# Patient Record
Sex: Female | Born: 1995 | Race: Black or African American | Hispanic: No | Marital: Single | Smoking: Never smoker
Health system: Southern US, Community
[De-identification: ages and names within clinical notes are randomized; demographics above are authoritative.]

## PROBLEM LIST (undated history)

## (undated) ENCOUNTER — Emergency Department (HOSPITAL_COMMUNITY): Admission: EM | Payer: BLUE CROSS/BLUE SHIELD | Source: Home / Self Care

## (undated) DIAGNOSIS — D6859 Other primary thrombophilia: Secondary | ICD-10-CM

## (undated) DIAGNOSIS — J45909 Unspecified asthma, uncomplicated: Secondary | ICD-10-CM

## (undated) HISTORY — PX: WISDOM TOOTH EXTRACTION: SHX21

---

## 2015-09-26 ENCOUNTER — Ambulatory Visit (HOSPITAL_COMMUNITY)
Admission: EM | Admit: 2015-09-26 | Discharge: 2015-09-26 | Disposition: A | Discharge status: Home / Self Care | Payer: BLUE CROSS/BLUE SHIELD | Attending: Emergency Medicine | Admitting: Emergency Medicine

## 2015-09-26 ENCOUNTER — Encounter (HOSPITAL_COMMUNITY): Payer: Self-pay | Admitting: *Deleted

## 2015-09-26 DIAGNOSIS — J45901 Unspecified asthma with (acute) exacerbation: Secondary | ICD-10-CM

## 2015-09-26 HISTORY — DX: Unspecified asthma, uncomplicated: J45.909

## 2015-09-26 MED ORDER — IPRATROPIUM-ALBUTEROL 0.5-2.5 (3) MG/3ML IN SOLN
RESPIRATORY_TRACT | Status: AC
Start: 2015-09-26 — End: 2015-09-26
  Filled 2015-09-26: qty 3

## 2015-09-26 MED ORDER — PREDNISONE 20 MG PO TABS: ORAL_TABLET | ORAL | Status: DC

## 2015-09-26 MED ORDER — IPRATROPIUM-ALBUTEROL 0.5-2.5 (3) MG/3ML IN SOLN
3.0000 mL | Freq: Once | RESPIRATORY_TRACT | Status: AC
  Administered 2015-09-26: 3 mL via RESPIRATORY_TRACT

## 2015-09-26 NOTE — ED Notes (Signed)
pefr  300   Prior  To  treatment

## 2015-09-26 NOTE — ED Provider Notes (Signed)
CSN: [id number]     Arrival date & time 09/26/15  1644 History   First MD Initiated Contact with Patient 09/26/15 1741     Chief Complaint  Patient presents with  . Asthma   (Consider location/radiation/quality/duration/timing/severity/associated sxs/prior Treatment) HPI Patient states that she had used her inhaler 3 times today due to asthma attacks. States that her chest feels tight. States that she has never used steroids for her asthma. Generally uses her rescue inhaler and does not have any other problem.  Past Medical History  Diagnosis Date  . Asthma    No past surgical history on file. No family history on file. Social History  Substance Use Topics  . Smoking status: None  . Smokeless tobacco: None  . Alcohol Use: None   OB History    No data available     Review of Systems Asthma flare  Allergies  Citrus  Home Medications   Prior to Admission medications   Medication Sig Start Date End Date Taking? Authorizing Provider  ALBUTEROL IN Inhale into the lungs.   Yes Historical Provider, MD  Montelukast Sodium (SINGULAIR PO) Take by mouth.   Yes Historical Provider, MD   Meds Ordered and Administered this Visit  Medications - No data to display  BP 124/83 mmHg  Pulse 74  Temp(Src) 98.6 F (37 C) (Oral)  Resp 16  SpO2 100% No data found.   Physical Exam NURSES NOTES AND VITAL SIGNS REVIEWED. CONSTITUTIONAL: Well developed, well nourished, no acute distress HEENT: normocephalic, atraumatic, right and left TM's are normal EYES: Conjunctiva normal NECK:normal ROM, supple, no adenopathy PULMONARY:No respiratory distress, normal effort, Lungs: CTAb/l, no wheezes, or increased work of breathing, speaking in full sentences. No wheezing.  CARDIOVASCULAR: RRR, no murmur ABDOMEN: soft, ND, NT, +'ve BS MUSCULOSKELETAL: Normal ROM of all extremities,  SKIN: warm and dry without rash PSYCHIATRIC: Mood and affect, behavior are normal  ED Course  Procedures  (including critical care time)  Labs Review Labs Reviewed - No data to display  Imaging Review No results found.   Visual Acuity Review  Right Eye Distance:   Left Eye Distance:   Bilateral Distance:    Right Eye Near:   Left Eye Near:    Bilateral Near:     Pretreatment PEFR<350 Post treatement  >400 She states she is feeling better.  Rx for prednisone given as wll a PEFR meter and parameters to return to hospital Pt states that is going home Friday and can follow up with her doctor at that time.     MDM   1. Acute asthma flare, unspecified asthma severity     Patient is reassured that there are no issues that require transfer to higher level of care at this time or additional tests. Patient is advised to continue home symptomatic treatment. Patient is advised that if there are new or worsening symptoms to attend the emergency department, contact primary care provider, or return to [facility]. Instructions of care provided discharged home in stable condition.    THIS NOTE WAS GENERATED USING A VOICE RECOGNITION SOFTWARE PROGRAM. ALL REASONABLE EFFORTS  WERE MADE TO PROOFREAD THIS DOCUMENT FOR ACCURACY.  I have verbally reviewed the discharge instructions with the patient. A printed AVS was given to the patient.  All questions were answered prior to discharge.      Tharon AquasFrank C Patrick, PA 09/26/15 [phone]

## 2015-09-26 NOTE — Discharge Instructions (Signed)
Asthma, Adult Keep an eye on PEAK Flow as this can predict when you need to go to the hospital Asthma is a condition of the lungs in which the airways tighten and narrow. Asthma can make it hard to breathe. Asthma cannot be cured, but medicine and lifestyle changes can help control it. Asthma may be started (triggered) by:  Animal skin flakes (dander).  Dust.  Cockroaches.  Pollen.  Mold.  Smoke.  Cleaning products.  Hair sprays or aerosol sprays.  Paint fumes or strong smells.  Cold air, weather changes, and winds.  Crying or laughing hard.  Stress.  Certain medicines or drugs.  Foods, such as dried fruit, potato chips, and sparkling grape juice.  Infections or conditions (colds, flu).  Exercise.  Certain medical conditions or diseases.  Exercise or tiring activities. HOME CARE   Take medicine as told by your doctor.  Use a peak flow meter as told by your doctor. A peak flow meter is a tool that measures how well the lungs are working.  Record and keep track of the peak flow meter's readings.  Understand and use the asthma action plan. An asthma action plan is a written plan for taking care of your asthma and treating your attacks.  To help prevent asthma attacks:  Do not smoke. Stay away from secondhand smoke.  Change your heating and air conditioning filter often.  Limit your use of fireplaces and wood stoves.  Get rid of pests (such as roaches and mice) and their droppings.  Throw away plants if you see mold on them.  Clean your floors. Dust regularly. Use cleaning products that do not smell.  Have someone vacuum when you are not home. Use a vacuum cleaner with a HEPA filter if possible.  Replace carpet with wood, tile, or vinyl flooring. Carpet can trap animal skin flakes and dust.  Use allergy-proof pillows, mattress covers, and box spring covers.  Wash bed sheets and blankets every week in hot water and dry them in a dryer.  Use blankets  that are made of polyester or cotton.  Clean bathrooms and kitchens with bleach. If possible, have someone repaint the walls in these rooms with mold-resistant paint. Keep out of the rooms that are being cleaned and painted.  Wash hands often. GET HELP IF:  You have make a whistling sound when breaking (wheeze), have shortness of breath, or have a cough even if taking medicine to prevent attacks.  The colored mucus you cough up (sputum) is thicker than usual.  The colored mucus you cough up changes from clear or white to yellow, green, gray, or bloody.  You have problems from the medicine you are taking such as:  A rash.  Itching.  Swelling.  Trouble breathing.  You need reliever medicines more than 2-3 times a week.  Your peak flow measurement is still at 50-79% of your personal best after following the action plan for 1 hour.  You have a fever. GET HELP RIGHT AWAY IF:   You seem to be worse and are not responding to medicine during an asthma attack.  You are short of breath even at rest.  You get short of breath when doing very little activity.  You have trouble eating, drinking, or talking.  You have chest pain.  You have a fast heartbeat.  Your lips or fingernails start to turn blue.  You are light-headed, dizzy, or faint.  Your peak flow is less than 50% of your personal best.  This information is not intended to replace advice given to you by your health care provider. Make sure you discuss any questions you have with your health care provider.   Document Released: 11/21/2007 Document Revised: 02/23/2015 Document Reviewed: 01/01/2013 Elsevier Interactive Patient Education 2016 Elsevier Inc.  Bronchospasm, Adult A bronchospasm is a spasm or tightening of the airways going into the lungs. During a bronchospasm breathing becomes more difficult because the airways get smaller. When this happens there can be coughing, a whistling sound when breathing  (wheezing), and difficulty breathing. Bronchospasm is often associated with asthma, but not all patients who experience a bronchospasm have asthma. CAUSES  A bronchospasm is caused by inflammation or irritation of the airways. The inflammation or irritation may be triggered by:   Allergies (such as to animals, pollen, food, or mold). Allergens that cause bronchospasm may cause wheezing immediately after exposure or many hours later.   Infection. Viral infections are believed to be the most common cause of bronchospasm.   Exercise.   Irritants (such as pollution, cigarette smoke, strong odors, aerosol sprays, and paint fumes).   Weather changes. Winds increase molds and pollens in the air. Rain refreshes the air by washing irritants out. Cold air may cause inflammation.   Stress and emotional upset.  SIGNS AND SYMPTOMS   Wheezing.   Excessive nighttime coughing.   Frequent or severe coughing with a simple cold.   Chest tightness.   Shortness of breath.  DIAGNOSIS  Bronchospasm is usually diagnosed through a history and physical exam. Tests, such as chest X-rays, are sometimes done to look for other conditions. TREATMENT   Inhaled medicines can be given to open up your airways and help you breathe. The medicines can be given using either an inhaler or a nebulizer machine.  Corticosteroid medicines may be given for severe bronchospasm, usually when it is associated with asthma. HOME CARE INSTRUCTIONS   Always have a plan prepared for seeking medical care. Know when to call your health care provider and local emergency services (911 in the [place] Know where you can access local emergency care.  Only take medicines as directed by your health care provider.  If you were prescribed an inhaler or nebulizer machine, ask your health care provider to explain how to use it correctly. Always use a spacer with your inhaler if you were given one.  It is necessary to remain calm  during an attack. Try to relax and breathe more slowly.  Control your home environment in the following ways:   Change your heating and air conditioning filter at least once a month.   Limit your use of fireplaces and wood stoves.  Do not smoke and do not allow smoking in your home.   Avoid exposure to perfumes and fragrances.   Get rid of pests (such as roaches and mice) and their droppings.   Throw away plants if you see mold on them.   Keep your house clean and dust free.   Replace carpet with wood, tile, or vinyl flooring. Carpet can trap dander and dust.   Use allergy-proof pillows, mattress covers, and box spring covers.   Wash bed sheets and blankets every week in hot water and dry them in a dryer.   Use blankets that are made of polyester or cotton.   Wash hands frequently. SEEK MEDICAL CARE IF:   You have muscle aches.   You have chest pain.   The sputum changes from clear or white to yellow, green, gray,  or bloody.   The sputum you cough up gets thicker.   There are problems that may be related to the medicine you are given, such as a rash, itching, swelling, or trouble breathing.  SEEK IMMEDIATE MEDICAL CARE IF:   You have worsening wheezing and coughing even after taking your prescribed medicines.   You have increased difficulty breathing.   You develop severe chest pain. MAKE SURE YOU:   Understand these instructions.  Will watch your condition.  Will get help right away if you are not doing well or get worse.   This information is not intended to replace advice given to you by your health care provider. Make sure you discuss any questions you have with your health care provider.   Document Released: 06/07/2003 Document Revised: 06/25/2014 Document Reviewed: 11/24/2012 Elsevier Interactive Patient Education [employer] Elsevier Inc.

## 2015-09-26 NOTE — ED Notes (Signed)
Pt   Reports    Developed  Some  Tightness  In  Chest     With       Symptoms  Not  releived   By  Her rescue  Inhalers

## 2015-10-19 ENCOUNTER — Encounter (HOSPITAL_COMMUNITY): Payer: Self-pay | Admitting: Emergency Medicine

## 2015-10-19 ENCOUNTER — Ambulatory Visit (HOSPITAL_COMMUNITY)
Admission: EM | Admit: 2015-10-19 | Discharge: 2015-10-19 | Disposition: A | Discharge status: Home / Self Care | Payer: BLUE CROSS/BLUE SHIELD | Attending: Family Medicine | Admitting: Family Medicine

## 2015-10-19 DIAGNOSIS — B001 Herpesviral vesicular dermatitis: Secondary | ICD-10-CM

## 2015-10-19 HISTORY — DX: Other primary thrombophilia: D68.59

## 2015-10-19 MED ORDER — PENCICLOVIR 1 % EX CREA: 1.0000 "application " | TOPICAL_CREAM | CUTANEOUS | Status: DC

## 2015-10-19 MED ORDER — VALACYCLOVIR HCL 1 G PO TABS: ORAL_TABLET | ORAL | Status: DC

## 2015-10-19 NOTE — ED Provider Notes (Signed)
CSN: [id number]     Arrival date & time 10/19/15  1555 History   First MD Initiated Contact with Patient 10/19/15 1723     Chief Complaint  Patient presents with  . Edema   (Consider location/radiation/quality/duration/timing/severity/associated sxs/prior Treatment) HPI Comments: 20 year old females complaining of a cold sore to the upper lip associated with swelling. She applied Abreva and she states the swelling got worse. There is swelling to the right side of the upper lip it has formed a sphere of firmness within the vermilion. Minor tenderness and pain locally. No other associated swelling. No intraoral swelling.   Past Medical History  Diagnosis Date  . Asthma    History reviewed. No pertinent past surgical history. No family history on file. Social History  Substance Use Topics  . Smoking status: Never Smoker   . Smokeless tobacco: None  . Alcohol Use: No   OB History    No data available     Review of Systems  Constitutional: Negative.   HENT: Positive for mouth sores. Negative for congestion, drooling, sinus pressure and sore throat.   Eyes: Negative.   Respiratory: Negative.   Neurological: Negative.     Allergies  Citrus  Home Medications   Prior to Admission medications   Medication Sig Start Date End Date Taking? Authorizing Provider  ALBUTEROL IN Inhale into the lungs.    Historical Provider, MD  Montelukast Sodium (SINGULAIR PO) Take by mouth.    Historical Provider, MD  penciclovir (DENAVIR) 1 % cream Apply 1 application topically every 2 (two) hours. for 3 days 10/19/15   Hayden Rasmussen, NP  predniSONE (DELTASONE) 20 MG tablet 40 mg daily for 5 days. 09/26/15   Tharon Aquas, PA  valACYclovir (VALTREX) 1000 MG tablet Take 2 tabs po now and 2 tabs in 12 hours. 10/19/15   Hayden Rasmussen, NP   Meds Ordered and Administered this Visit  Medications - No data to display  BP 121/83 mmHg  Pulse 67  Temp(Src) 98.4 F (36.9 C) (Oral)  SpO2 98% No data  found.   Physical Exam  Constitutional: She is oriented to person, place, and time. She appears well-developed and well-nourished. No distress.  HENT:  Head: Normocephalic and atraumatic.  Mouth/Throat: Oropharynx is clear and moist.  As per history of present illness there is spherical swelling to the right side of the upper lip.Overlying the vermilion and mucosal aspect are vesicles and small 1-2 mm size red annular lesions and early ulcerations.  Eyes: Conjunctivae are normal.  Neck: Normal range of motion. Neck supple.  Cardiovascular: Normal rate.   Pulmonary/Chest: Effort normal.  Neurological: She is alert and oriented to person, place, and time. She exhibits normal muscle tone.  Skin: Skin is warm and dry. No rash noted.  Psychiatric: She has a normal mood and affect.  Nursing note and vitals reviewed.   ED Course  Procedures (including critical care time)  Labs Review Labs Reviewed - No data to display  Imaging Review No results found.   Visual Acuity Review  Right Eye Distance:   Left Eye Distance:   Bilateral Distance:    Right Eye Near:   Left Eye Near:    Bilateral Near:         MDM   1. Cold sore   2. Herpes simplex labialis    Meds ordered this encounter  Medications  . valACYclovir (VALTREX) 1000 MG tablet    Sig: Take 2 tabs po now and 2 tabs in 12  hours.    Dispense:  4 tablet    Refill:  0    Order Specific Question:  Supervising Provider    Answer:  Linna HoffKINDL, JAMES D [phone][[id number]]  . penciclovir (DENAVIR) 1 % cream    Sig: Apply 1 application topically every 2 (two) hours. for 3 days    Dispense:  1.5 g    Refill:  0    Order Specific Question:  Supervising Provider    Answer:  Linna HoffKINDL, JAMES D [[id number]]       Hayden Rasmussenavid Tanairi Cypert, NP 10/19/15 [phone]

## 2015-10-19 NOTE — ED Notes (Signed)
Patient cincerned for possible "allergic reaction".  Patient has a very swollen lip.

## 2015-10-19 NOTE — Discharge Instructions (Signed)
Cold Sore A cold sore (fever blister) is a skin infection caused by a certain type of germ (virus). They are small sores filled with fluid that dry up and heal within 2 weeks. Cold sores form inside of the mouth or on the lips, gums, and other parts of the body. Cold sores can be easily passed (contagious) to other people. This can happen through close personal contact, such as kissing or sharing a drinking glass. HOME CARE  Only take medicine as told by your doctor. Do not use aspirin.  Use a cotton-tip swab to put creams or gels on your sores.  Do not touch sores or pick scabs. Wash your hands often. Do not touch your eyes without washing your hands first.  Avoid kissing, oral sex, and sharing personal items until the sores heal.  Put an ice pack on your sores for 10-15 minutes to ease discomfort.  Avoid hot, cold, or salty foods. Eat a soft, bland diet. Use a straw to drink if it helps lessen pain.  Keep sores clean and dry.  Avoid the sun and limit stress if these things cause you to have sores. Apply sunscreen on your lips if the sun causes cold sores. GET HELP IF:  You have a fever or lasting symptoms for more than 2-3 days.  You have a fever and your symptoms suddenly get worse.  You have yellow-white fluid (not clear) coming from the sores.  You have redness that is spreading.  You have pain or irritation in your eye.  You get sores on your genitals.  Your sores do not heal within 2 weeks.  You have a tough time fighting off sickness and infections (weakened immune system).  You get cold sores often. MAKE SURE YOU:   Understand these instructions.  Will watch your condition.  Will get help right away if you are not doing well or get worse.   This information is not intended to replace advice given to you by your health care provider. Make sure you discuss any questions you have with your health care provider.   Document Released: 12/04/2011 Document Reviewed:  12/04/2011 Elsevier Interactive Patient Education 2016 Else[employer] Inc.  Herpes Simplex Virus Herpes simplex virus is a viral infection that may infect many different areas of the body, such as the genitalia and mouth. There are two different strains of the virus: herpes simplex virus 1 (HSV-1) and herpes simplex virus 2 (HSV-2). HSV-1 is typically associated with infections of the mouth and lips. HSV-2 is associated with infections of the genitals. However, either strain of the virus may infect any area. HSV may be spread through saliva particles or sexual contact. One unusual form of HSV-1, known as herpes gladiatorum, is passed from skin-to-skin contact, such as in wrestling. SYMPTOMS   Sometimes, no symptoms.  Fever.  Headache.  Muscle aches.  Tingling.  Itching.  Tenderness.  Genital burning feeling.  Genital pain.  Pain with urination.  Pain with sexual intercourse.  Small blisters in the affected areas. RISK FACTORS   Kissing an infected person.  Sharing eating utensils with an infected person.  Unprotected sexual activity.  Multiple sexual partners.  Direct contact sports without protective clothing.  Contact with an exposed herpes sore.  Stress, illness, and cold increase the risk of recurrence. PROGNOSIS  The primary outbreak of an HSV infection usually lasts 2 to 3 weeks. However, it has been known to last up to 6 weeks. After the primary outbreak subsides, the virus goes into  a stage known as latency. During this time, there may be no physical symptoms of infection. After a period of time, some event, such as stress, cold, or illness will trigger another outbreak. This cycle of latency and outbreak may continue indefinitely. The outbreaks usually become milder over time. The body cannot rid itself of HSV. RELATED COMPLICATIONS   Recurrence.  Infection in other areas of the body, such as the eye (ocular herpetic infection, keratitis) and rarely the brain  (herpetic encephalitis). TREATMENT  Many HSV infections can be treated without medicine. During an outbreak, avoid touching the sores. Ice may be used to dull the pain and suppress the virus. Exposure to the sun is a common trigger for an outbreak, so the use of sunscreen may help in such cases. Avoid sexual contact during outbreaks. During the latent periods, it is advised that you use latex condoms, which will reduce the likelihood of spreading the virus to another person. Condoms made from animal products do not protect against HSV. Female condoms cover a larger area than female condoms, and may offer the most protection from the transmission of HSV. The presence of HSV will not affect a condom's ability to protect against pregnancy. Only take medicines for pain and discomfort if directed to do so by your caregiver. Many claims exist that certain dietary changes will prevent an outbreak, but these claims have not been proven. These claims include eating foods that are high in L-lysine and low in arginine (i.e. yogurt, beets, apples, pears, mangoes, oily fish (such as salmon, haddock, snapper, and swordfish), soybean sprouts, chicken, and tomatoes).  Athletes may return to play once they are showing no symptoms, and they have been treated.    This information is not intended to replace advice given to you by your health care provider. Make sure you discuss any questions you have with your health care provider.   Document Released: 06/04/2005 Document Revised: 08/27/2011 Document Reviewed: 12/22/2014 Elsevier Interactive Patient Education [employer]2016 [employer] Inc.

## 2017-09-13 ENCOUNTER — Encounter (HOSPITAL_COMMUNITY): Payer: Self-pay | Admitting: Family Medicine

## 2017-09-13 ENCOUNTER — Ambulatory Visit (HOSPITAL_COMMUNITY)
Admission: EM | Admit: 2017-09-13 | Discharge: 2017-09-13 | Disposition: A | Discharge status: Home / Self Care | Payer: BLUE CROSS/BLUE SHIELD | Attending: Family Medicine | Admitting: Family Medicine

## 2017-09-13 DIAGNOSIS — S060X0A Concussion without loss of consciousness, initial encounter: Secondary | ICD-10-CM | POA: Diagnosis not present

## 2017-09-13 DIAGNOSIS — F0781 Postconcussional syndrome: Secondary | ICD-10-CM

## 2017-09-13 NOTE — ED Provider Notes (Signed)
[facility]    CSN: [id number] Arrival date & time: 09/13/17  1457     History   Chief Complaint Chief Complaint  Patient presents with  . Head Injury    HPI Barbara Peterson is a 22 y.o. female.   Patient was hit in the face by basketball and had some momentary loss of consciousness by history.  She did not fall down but now she has some symptoms such as mild headache blurry vision and dizziness consistent with postconcussion syndrome.  HPI  Past Medical History:  Diagnosis Date  . Asthma   . Protein S deficiency (HCC)     There are no active problems to display for this patient.   Past Surgical History:  Procedure Laterality Date  . WISDOM TOOTH EXTRACTION      OB History   None      Home Medications    Prior to Admission medications   Medication Sig Start Date End Date Taking? Authorizing Provider  ALBUTEROL IN Inhale into the lungs.    [provider]  Montelukast Sodium (SINGULAIR PO) Take by mouth.    [provider]  penciclovir (DENAVIR) 1 % cream Apply 1 application topically every 2 (two) hours. for 3 days 10/19/15   Hayden RasmussenMabe, David, NP  predniSONE (DELTASONE) 20 MG tablet 40 mg daily for 5 days. 09/26/15   Tharon AquasPatrick, Frank C, PA  valACYclovir (VALTREX) 1000 MG tablet Take 2 tabs po now and 2 tabs in 12 hours. 10/19/15   Hayden RasmussenMabe, David, NP    Family History History reviewed. No pertinent family history.  Social History Social History   Tobacco Use  . Smoking status: Never Smoker  Substance Use Topics  . Alcohol use: No  . Drug use: Not on file     Allergies   Citrus   Review of Systems Review of Systems  Constitutional: Negative.   HENT: Negative.   Respiratory: Negative.   Cardiovascular: Negative.   Gastrointestinal: Negative.   Neurological: Positive for dizziness and headaches.  Psychiatric/Behavioral: Negative.      Physical Exam Triage Vital Signs ED Triage Vitals  Enc Vitals Group     BP 09/13/17 1518  116/77     Pulse Rate 09/13/17 1518 90     Resp 09/13/17 1518 18     Temp 09/13/17 1518 98.2 F (36.8 C)     Temp src --      SpO2 09/13/17 1518 100 %     Weight --      Height --      Head Circumference --      Peak Flow --      Pain Score 09/13/17 1516 5     Pain Loc --      Pain Edu? --      Excl. in GC? --    No data found.  Updated Vital Signs BP 116/77   Pulse 90   Temp 98.2 F (36.8 C)   Resp 18   LMP 08/27/2017   SpO2 100%   Visual Acuity Right Eye Distance:   Left Eye Distance:   Bilateral Distance:    Right Eye Near:   Left Eye Near:    Bilateral Near:     Physical Exam  Constitutional: She is oriented to person, place, and time. She appears well-developed and well-nourished.  Cardiovascular: Normal rate and regular rhythm.  Pulmonary/Chest: Effort normal.  Neurological: She is alert and oriented to person, place, and time. She displays normal reflexes. No  cranial nerve deficit or sensory deficit. She exhibits normal muscle tone. Coordination normal.     UC Treatments / Results  Labs (all labs ordered are listed, but only abnormal results are displayed) Labs Reviewed - No data to display  EKG None Radiology No results found.  Procedures Procedures (including critical care time)  Medications Ordered in UC Medications - No data to display   Initial Impression / Assessment and Plan / UC Course  I have reviewed the triage vital signs and the nursing notes.  Pertinent labs & imaging results that were available during my care of the patient were reviewed by me and considered in my medical decision making (see chart for details).     Post concussion syndrome, mild.  Discuseed symptoms and hand-out given  Final Clinical Impressions(s) / UC Diagnoses   Final diagnoses:  Concussion without loss of consciousness, initial encounter    ED Discharge Orders    None       Controlled Substance Prescriptions [place] Controlled Substance Registry  consulted? Not Applicable   Frederica Kuster, MD 09/13/17 [phone]

## 2017-09-13 NOTE — ED Triage Notes (Signed)
Pt here for post concussion. Reports that she was hit with a basketball in the forehead yesterday and blacked out for a few seconds. sts some head pain and nausea after. She woke up this am dizzy.

## 2017-11-06 ENCOUNTER — Ambulatory Visit (HOSPITAL_COMMUNITY)
Admission: EM | Admit: 2017-11-06 | Discharge: 2017-11-06 | Disposition: A | Discharge status: Home / Self Care | Payer: BLUE CROSS/BLUE SHIELD | Attending: Family Medicine | Admitting: Family Medicine

## 2017-11-06 ENCOUNTER — Encounter (HOSPITAL_COMMUNITY): Payer: Self-pay | Admitting: Family Medicine

## 2017-11-06 DIAGNOSIS — J45909 Unspecified asthma, uncomplicated: Secondary | ICD-10-CM | POA: Diagnosis not present

## 2017-11-06 DIAGNOSIS — R5383 Other fatigue: Secondary | ICD-10-CM | POA: Diagnosis present

## 2017-11-06 DIAGNOSIS — Z79899 Other long term (current) drug therapy: Secondary | ICD-10-CM | POA: Insufficient documentation

## 2017-11-06 DIAGNOSIS — N898 Other specified noninflammatory disorders of vagina: Secondary | ICD-10-CM | POA: Diagnosis present

## 2017-11-06 LAB — POCT URINALYSIS DIP (DEVICE)
Bilirubin Urine: NEGATIVE
GLUCOSE, UA: NEGATIVE mg/dL
Hgb urine dipstick: NEGATIVE
KETONES UR: NEGATIVE mg/dL
Leukocytes, UA: NEGATIVE
Nitrite: NEGATIVE
PROTEIN: NEGATIVE mg/dL
SPECIFIC GRAVITY, URINE: 1.025 (ref 1.005–1.030)
Urobilinogen, UA: 1 mg/dL (ref 0.0–1.0)
pH: 6 (ref 5.0–8.0)

## 2017-11-06 LAB — GLUCOSE, CAPILLARY: GLUCOSE-CAPILLARY: 68 mg/dL (ref 65–99)

## 2017-11-06 MED ORDER — METRONIDAZOLE 500 MG PO TABS: 500.0000 mg | ORAL_TABLET | Freq: Two times a day (BID) | ORAL | 0 refills | Status: AC

## 2017-11-06 NOTE — Discharge Instructions (Signed)
Please begin taking metronidazole treat bacterial vaginosis.  Do not drink alcohol while taking.  We are testing you for Gonorrhea, Chlamydia, Trichomonas, Yeast and Bacterial Vaginosis. We will call you if anything is positive and let you know if you require any further treatment. Please inform partners of any positive results.   Please return if symptoms not improving with treatment, development of fever, nausea, vomiting, abdominal pain.

## 2017-11-06 NOTE — ED Triage Notes (Signed)
Pt  With no known hx of diabetes here for polyuria, polydipsia, fatigue and sts that her urine smells like "butter".  This has been intermittent. She also reports that she is having vaginal discharge with odor.

## 2017-11-07 LAB — CERVICOVAGINAL ANCILLARY ONLY
Bacterial vaginitis: POSITIVE — AB
CHLAMYDIA, DNA PROBE: NEGATIVE
Candida vaginitis: NEGATIVE
Neisseria Gonorrhea: NEGATIVE
Trichomonas: NEGATIVE

## 2017-11-07 NOTE — ED Provider Notes (Signed)
[facility]    CSN: [id number] Arrival date & time: 11/06/17  1028     History   Chief Complaint Chief Complaint  Patient presents with  . Fatigue  . Vaginitis    HPI Barbara Peterson is a 22 y.o. female history of asthma presenting today for evaluation of urinary frequency as well as vaginal discharge and odor.  Patient has a history of BV and believes that this is the cause of her symptoms.  She has noted increased urination and increased thirst and feeling like her urine smells like "butter".  Symptoms began yesterday.  She is also having an odor with her discharge.  She is concerned about diabetes.  Denies vaginal itching or irritation.  HPI  Past Medical History:  Diagnosis Date  . Asthma   . Protein S deficiency (HCC)     There are no active problems to display for this patient.   Past Surgical History:  Procedure Laterality Date  . WISDOM TOOTH EXTRACTION      OB History   None      Home Medications    Prior to Admission medications   Medication Sig Start Date End Date Taking? Authorizing Provider  ALBUTEROL IN Inhale into the lungs.    [provider]  metroNIDAZOLE (FLAGYL) 500 MG tablet Take 1 tablet (500 mg total) by mouth 2 (two) times daily for 7 days. 11/06/17 11/13/17  Mahogany Torrance C, PA-C  Montelukast Sodium (SINGULAIR PO) Take by mouth.    [provider]  penciclovir (DENAVIR) 1 % cream Apply 1 application topically every 2 (two) hours. for 3 days 10/19/15   Hayden Rasmussen, NP    Family History History reviewed. No pertinent family history.  Social History Social History   Tobacco Use  . Smoking status: Never Smoker  Substance Use Topics  . Alcohol use: No  . Drug use: Not on file     Allergies   Citrus   Review of Systems Review of Systems  Constitutional: Negative for fever.  Respiratory: Negative for shortness of breath.   Cardiovascular: Negative for chest pain.  Gastrointestinal: Negative for  abdominal pain, diarrhea, nausea and vomiting.  Endocrine: Positive for polydipsia and polyuria.  Genitourinary: Positive for frequency and vaginal discharge. Negative for dysuria, flank pain, genital sores, hematuria, menstrual problem, vaginal bleeding and vaginal pain.  Musculoskeletal: Negative for back pain.  Skin: Negative for rash.  Neurological: Negative for dizziness, light-headedness and headaches.     Physical Exam Triage Vital Signs ED Triage Vitals [11/06/17 1122]  Enc Vitals Group     BP 125/85     Pulse Rate 77     Resp 18     Temp 98.3 F (36.8 C)     Temp src      SpO2 100 %     Weight      Height      Head Circumference      Peak Flow      Pain Score 0     Pain Loc      Pain Edu?      Excl. in GC?    No data found.  Updated Vital Signs BP 125/85   Pulse 77   Temp 98.3 F (36.8 C)   Resp 18   LMP 10/18/2017   SpO2 100%   Visual Acuity Right Eye Distance:   Left Eye Distance:   Bilateral Distance:    Right Eye Near:   Left Eye Near:  Bilateral Near:     Physical Exam  Constitutional: She appears well-developed and well-nourished. No distress.  HENT:  Head: Normocephalic and atraumatic.  Eyes: Conjunctivae are normal.  Neck: Neck supple.  Cardiovascular: Normal rate and regular rhythm.  No murmur heard. Pulmonary/Chest: Effort normal and breath sounds normal. No respiratory distress.  Abdominal: Soft. There is no tenderness.  Nontender to light and deep palpation  Genitourinary:  Genitourinary Comments: Deferred  Musculoskeletal: She exhibits no edema.  Neurological: She is alert.  Skin: Skin is warm and dry.  Psychiatric: She has a normal mood and affect.  Nursing note and vitals reviewed.    UC Treatments / Results  Labs (all labs ordered are listed, but only abnormal results are displayed) Labs Reviewed  GLUCOSE, CAPILLARY  POCT URINALYSIS DIP (DEVICE)  CERVICOVAGINAL ANCILLARY ONLY    EKG None  Radiology No  results found.  Procedures Procedures (including critical care time)  Medications Ordered in UC Medications - No data to display  Initial Impression / Assessment and Plan / UC Course  I have reviewed the triage vital signs and the nursing notes.  Pertinent labs & imaging results that were available during my care of the patient were reviewed by me and considered in my medical decision making (see chart for details).     Blood sugar stable, UA unremarkable.  Will go and initiate treatment for BV with metronidazole orally.  Vaginal swab obtained and will send off to confirm results.  Advised odor with urine likely related to BV as well.  Follow-up if not improving with treatment.  Discussed strict return precautions. Patient verbalized understanding and is agreeable with plan.  Final Clinical Impressions(s) / UC Diagnoses   Final diagnoses:  Vaginal discharge     Discharge Instructions     Please begin taking metronidazole treat bacterial vaginosis.  Do not drink alcohol while taking.  We are testing you for Gonorrhea, Chlamydia, Trichomonas, Yeast and Bacterial Vaginosis. We will call you if anything is positive and let you know if you require any further treatment. Please inform partners of any positive results.   Please return if symptoms not improving with treatment, development of fever, nausea, vomiting, abdominal pain.    ED Prescriptions    Medication Sig Dispense Auth. Provider   metroNIDAZOLE (FLAGYL) 500 MG tablet Take 1 tablet (500 mg total) by mouth 2 (two) times daily for 7 days. 14 tablet Kindra Bickham, [place] C, PA-C     Controlled Substance Prescriptions [place] Controlled Substance Registry consulted? No   Lew Dawes, [place] 11/07/17 [phone]

## 2017-11-09 ENCOUNTER — Telehealth (HOSPITAL_COMMUNITY): Payer: Self-pay

## 2017-11-09 NOTE — Telephone Encounter (Signed)
Bacterial Vaginosis test is positive.  Prescription for metronidazole was given at the urgent care visit. Pt contacted regarding results. Answered all questions. Verbalized understanding.   

## 2018-01-20 ENCOUNTER — Ambulatory Visit (HOSPITAL_COMMUNITY)
Admission: EM | Admit: 2018-01-20 | Discharge: 2018-01-20 | Disposition: A | Discharge status: Home / Self Care | Payer: BLUE CROSS/BLUE SHIELD | Attending: Family Medicine | Admitting: Family Medicine

## 2018-01-20 ENCOUNTER — Encounter (HOSPITAL_COMMUNITY): Payer: Self-pay | Admitting: Emergency Medicine

## 2018-01-20 DIAGNOSIS — N898 Other specified noninflammatory disorders of vagina: Secondary | ICD-10-CM | POA: Diagnosis present

## 2018-01-20 DIAGNOSIS — N760 Acute vaginitis: Secondary | ICD-10-CM | POA: Diagnosis not present

## 2018-01-20 LAB — POCT PREGNANCY, URINE: Preg Test, Ur: NEGATIVE

## 2018-01-20 LAB — POCT URINALYSIS DIP (DEVICE)
Bilirubin Urine: NEGATIVE
GLUCOSE, UA: NEGATIVE mg/dL
HGB URINE DIPSTICK: NEGATIVE
KETONES UR: NEGATIVE mg/dL
LEUKOCYTES UA: NEGATIVE
Nitrite: NEGATIVE
PROTEIN: NEGATIVE mg/dL
SPECIFIC GRAVITY, URINE: 1.02 (ref 1.005–1.030)
Urobilinogen, UA: 2 mg/dL — ABNORMAL HIGH (ref 0.0–1.0)
pH: 7 (ref 5.0–8.0)

## 2018-01-20 MED ORDER — FLUCONAZOLE 150 MG PO TABS: 150.0000 mg | ORAL_TABLET | Freq: Once | ORAL | 0 refills | Status: AC

## 2018-01-20 NOTE — Discharge Instructions (Signed)
Your vaginal swab is pending.  Will notify you of any positive findings and if any changes to treatment are needed.   You may also monitor your results on your MyChart.  We will start with a one time pill for yeast that you may take now.  If symptoms worsen or do not improve in the next week to return to be seen or to follow up with your PCP.

## 2018-01-20 NOTE — ED Triage Notes (Signed)
PT reports thick vaginal discharge for 3 days. No dysuria, no abdpmional pain

## 2018-01-20 NOTE — ED Provider Notes (Signed)
[facility]    CSN: [id number] Arrival date & time: 01/20/18  1719     History   Chief Complaint Chief Complaint  Patient presents with  . Vaginal Discharge    Appointment 530    HPI Barbara Peterson is a 22 y.o. female.   [name] presents with complaints of vaginal itching with thick white vaginal discharge. No odor. Feels irritated. No bleeding. LMP 7/29. Denies any previous similar. Has had BV in the past but states it was different. No urinary symptoms. No fevers, no abdominal or back pain. Sexually active with 2-3 partners in the past 6 months. Has not been active in the past 2 months however. Uses condoms, no known std exposures. Hx of asthma and prtein s deficiency.     ROS per HPI.      Past Medical History:  Diagnosis Date  . Asthma   . Protein S deficiency (HCC)     There are no active problems to display for this patient.   Past Surgical History:  Procedure Laterality Date  . WISDOM TOOTH EXTRACTION      OB History   None      Home Medications    Prior to Admission medications   Medication Sig Start Date End Date Taking? Authorizing Provider  ALBUTEROL IN Inhale into the lungs.    [provider]  fluconazole (DIFLUCAN) 150 MG tablet Take 1 tablet (150 mg total) by mouth once for 1 dose. 01/20/18 01/20/18  Georgetta Haber, NP    Family History No family history on file.  Social History Social History   Tobacco Use  . Smoking status: Never Smoker  Substance Use Topics  . Alcohol use: No  . Drug use: Not on file     Allergies   Citrus   Review of Systems Review of Systems   Physical Exam Triage Vital Signs ED Triage Vitals  Enc Vitals Group     BP 01/20/18 1730 119/75     Pulse Rate 01/20/18 1730 79     Resp 01/20/18 1730 16     Temp 01/20/18 1730 98.8 F (37.1 C)     Temp Source 01/20/18 1730 Oral     SpO2 01/20/18 1730 100 %     Weight 01/20/18 1731 112 lb (50.8 kg)     Height --      Head Circumference  --      Peak Flow --      Pain Score 01/20/18 1731 0     Pain Loc --      Pain Edu? --      Excl. in GC? --    No data found.  Updated Vital Signs BP 119/75 (BP Location: Left Arm)   Pulse 79   Temp 98.8 F (37.1 C) (Oral)   Resp 16   Wt 112 lb (50.8 kg)   LMP 01/13/2018   SpO2 100%    Physical Exam  Constitutional: She is oriented to person, place, and time. She appears well-developed and well-nourished. No distress.  Cardiovascular: Normal rate, regular rhythm and normal heart sounds.  Pulmonary/Chest: Effort normal and breath sounds normal.  Abdominal: Soft. There is no tenderness. There is no rigidity, no rebound, no guarding and no CVA tenderness.  Genitourinary:  Genitourinary Comments: Denies pain, bleeding, sores or lesions; self swab collected, pelvic exam deferred   Neurological: She is alert and oriented to person, place, and time.  Skin: Skin is warm and dry.     UC  Treatments / Results  Labs (all labs ordered are listed, but only abnormal results are displayed) Labs Reviewed  CERVICOVAGINAL ANCILLARY ONLY    EKG None  Radiology No results found.  Procedures Procedures (including critical care time)  Medications Ordered in UC Medications - No data to display  Initial Impression / Assessment and Plan / UC Course  I have reviewed the triage vital signs and the nursing notes.  Pertinent labs & imaging results that were available during my care of the patient were reviewed by me and considered in my medical decision making (see chart for details).     Diflucan provided pending vaginal cytology results. Will notify of any positive findings and if any changes to treatment are needed.  Patient verbalized understanding and agreeable to plan.    Final Clinical Impressions(s) / UC Diagnoses   Final diagnoses:  Acute vaginitis     Discharge Instructions     Your vaginal swab is pending.  Will notify you of any positive findings and if any changes  to treatment are needed.   You may also monitor your results on your MyChart.  We will start with a one time pill for yeast that you may take now.  If symptoms worsen or do not improve in the next week to return to be seen or to follow up with your PCP.     ED Prescriptions    Medication Sig Dispense Auth. Provider   fluconazole (DIFLUCAN) 150 MG tablet Take 1 tablet (150 mg total) by mouth once for 1 dose. 1 tablet Georgetta HaberBurky, Lisette Mancebo B, NP     Controlled Substance Prescriptions [place] Controlled Substance Registry consulted? Not Applicable   Georgetta HaberBurky, Taeveon Keesling B, NP 01/20/18 1805

## 2018-01-21 LAB — CERVICOVAGINAL ANCILLARY ONLY
Bacterial vaginitis: NEGATIVE
CHLAMYDIA, DNA PROBE: NEGATIVE
Candida vaginitis: POSITIVE — AB
NEISSERIA GONORRHEA: NEGATIVE
TRICH (WINDOWPATH): NEGATIVE

## 2018-03-27 ENCOUNTER — Ambulatory Visit (HOSPITAL_COMMUNITY)
Admission: EM | Admit: 2018-03-27 | Discharge: 2018-03-27 | Disposition: A | Discharge status: Home / Self Care | Payer: BLUE CROSS/BLUE SHIELD | Attending: Nurse Practitioner | Admitting: Nurse Practitioner

## 2018-03-27 ENCOUNTER — Encounter (HOSPITAL_COMMUNITY): Payer: Self-pay | Admitting: Emergency Medicine

## 2018-03-27 DIAGNOSIS — R05 Cough: Secondary | ICD-10-CM

## 2018-03-27 DIAGNOSIS — B9789 Other viral agents as the cause of diseases classified elsewhere: Secondary | ICD-10-CM | POA: Insufficient documentation

## 2018-03-27 DIAGNOSIS — J028 Acute pharyngitis due to other specified organisms: Secondary | ICD-10-CM | POA: Insufficient documentation

## 2018-03-27 DIAGNOSIS — J029 Acute pharyngitis, unspecified: Secondary | ICD-10-CM

## 2018-03-27 LAB — POCT RAPID STREP A: Streptococcus, Group A Screen (Direct): NEGATIVE

## 2018-03-27 NOTE — ED Triage Notes (Signed)
PT reports a sore, scratchy throat and nasal congestion that started yesterday.

## 2018-03-27 NOTE — ED Provider Notes (Signed)
[facility]    CSN: [id number] Arrival date & time: 03/27/18  1005     History   Chief Complaint Chief Complaint  Patient presents with  . Appointment  . Sore Throat    HPI Barbara Peterson is a 22 y.o. female.   Subjective:   History was provided by the patient. Barbara Peterson is a 22 y.o. female who presents for evaluation of a sore throat. Associated symptoms include dry cough and nasal congestion. Onset of symptoms was 1 day ago and is gradually worsening since that time. She denies any fevers, ear pain, rhinorrhea, shortness of breath, nausea or vomiting. She is drinking plenty of fluids. She has not had recent close exposure to someone with proven streptococcal pharyngitis.  The following portions of the patient's history were reviewed and updated as appropriate: allergies, current medications, past family history, past medical history, past social history, past surgical history and problem list.       Past Medical History:  Diagnosis Date  . Asthma   . Protein S deficiency (HCC)     There are no active problems to display for this patient.   Past Surgical History:  Procedure Laterality Date  . WISDOM TOOTH EXTRACTION      OB History   None      Home Medications    Prior to Admission medications   Medication Sig Start Date End Date Taking? Authorizing Provider  fluticasone (FLONASE) 50 MCG/ACT nasal spray Place into both nostrils daily.   Yes [provider]  ALBUTEROL IN Inhale into the lungs.    [provider]    Family History No family history on file.  Social History Social History   Tobacco Use  . Smoking status: Never Smoker  Substance Use Topics  . Alcohol use: No  . Drug use: Not on file     Allergies   Citrus   Review of Systems Review of Systems  Constitutional: Negative for fever.  HENT: Positive for congestion and sore throat. Negative for ear pain, rhinorrhea and trouble swallowing.     Respiratory: Positive for cough. Negative for shortness of breath and wheezing.   Cardiovascular: Negative for chest pain.  Gastrointestinal: Negative for nausea and vomiting.  Neurological: Negative for headaches.  All other systems reviewed and are negative.    Physical Exam Triage Vital Signs ED Triage Vitals  Enc Vitals Group     BP 03/27/18 1023 119/87     Pulse Rate 03/27/18 1023 98     Resp 03/27/18 1023 16     Temp 03/27/18 1023 98.5 F (36.9 C)     Temp Source 03/27/18 1023 Oral     SpO2 03/27/18 1023 99 %     Weight 03/27/18 1022 115 lb (52.2 kg)     Height --      Head Circumference --      Peak Flow --      Pain Score 03/27/18 1021 6     Pain Loc --      Pain Edu? --      Excl. in GC? --    No data found.  Updated Vital Signs BP 119/87   Pulse 98   Temp 98.5 F (36.9 C) (Oral)   Resp 16   Wt 115 lb (52.2 kg)   LMP 03/25/2018   SpO2 99%   Visual Acuity Right Eye Distance:   Left Eye Distance:   Bilateral Distance:    Right Eye Near:   Left  Eye Near:    Bilateral Near:     Physical Exam  Constitutional: She is oriented to person, place, and time. She appears well-developed and well-nourished.  HENT:  Head: Normocephalic.  Right Ear: Tympanic membrane normal.  Left Ear: Tympanic membrane normal.  Mouth/Throat: Uvula is midline, oropharynx is clear and moist and mucous membranes are normal. No oral lesions. No uvula swelling. No oropharyngeal exudate, posterior oropharyngeal edema or posterior oropharyngeal erythema.  Neck: Normal range of motion. Neck supple.  Cardiovascular: Normal rate.  Pulmonary/Chest: Effort normal and breath sounds normal.  Lymphadenopathy:    She has no cervical adenopathy.  Neurological: She is alert and oriented to person, place, and time.  Skin: Skin is warm and dry.  Psychiatric: She has a normal mood and affect. Her behavior is normal.     UC Treatments / Results  Labs (all labs ordered are listed, but only  abnormal results are displayed) Labs Reviewed  CULTURE, GROUP A STREP [facility])  POCT RAPID STREP A    EKG None  Radiology No results found.  Procedures Procedures (including critical care time)  Medications Ordered in UC Medications - No data to display  Initial Impression / Assessment and Plan / UC Course  I have reviewed the triage vital signs and the nursing notes.  Pertinent labs & imaging results that were available during my care of the patient were reviewed by me and considered in my medical decision making (see chart for details).    22 yo female presenting with sore throat, cough and nasal congestion x 1 day.  She is afebrile.  Nontoxic-appearing.  No signs stable.  Rapid strep negative. Symptoms likely due to an acute viral illness. Supportive care advised at this time.    Plan:  Use of OTC analgesics recommended as well as salt water gargles. Use of decongestant recommended. Follow up as needed.  Today's evaluation has revealed no signs of a dangerous process. Discussed diagnosis with patient. Patient aware of their diagnosis, possible red flag symptoms to watch out for and need for close follow up. Patient understands verbal and written discharge instructions. Patient comfortable with plan and disposition.  Patient has a clear mental status at this time, good insight into illness (after discussion and teaching) and has clear judgment to make decisions regarding their care.  Documentation was completed with the aid of voice recognition software. Transcription may contain typographical errors. Final Clinical Impressions(s) / UC Diagnoses   Final diagnoses:  Viral pharyngitis   Discharge Instructions   None    ED Prescriptions    None     Controlled Substance Prescriptions [place] Controlled Substance Registry consulted? Not Applicable   Lurline Idol, FNP 03/27/18 1115

## 2018-03-29 LAB — CULTURE, GROUP A STREP (THRC)

## 2018-03-31 ENCOUNTER — Ambulatory Visit (HOSPITAL_COMMUNITY)
Admission: EM | Admit: 2018-03-31 | Discharge: 2018-03-31 | Disposition: A | Discharge status: Home / Self Care | Payer: BLUE CROSS/BLUE SHIELD | Attending: Family Medicine | Admitting: Family Medicine

## 2018-03-31 ENCOUNTER — Encounter (HOSPITAL_COMMUNITY): Payer: Self-pay | Admitting: Emergency Medicine

## 2018-03-31 ENCOUNTER — Other Ambulatory Visit: Payer: Self-pay

## 2018-03-31 DIAGNOSIS — J4541 Moderate persistent asthma with (acute) exacerbation: Secondary | ICD-10-CM | POA: Diagnosis not present

## 2018-03-31 MED ORDER — ALBUTEROL SULFATE HFA 108 (90 BASE) MCG/ACT IN AERS: 2.0000 | INHALATION_SPRAY | RESPIRATORY_TRACT | 1 refills | Status: AC | PRN

## 2018-03-31 MED ORDER — PREDNISONE 10 MG (21) PO TBPK: ORAL_TABLET | Freq: Every day | ORAL | 0 refills | Status: DC

## 2018-03-31 NOTE — ED Provider Notes (Signed)
[facility] CARE CENTER   [id number] 03/31/18 Arrival Time: 1112  ASSESSMENT & PLAN:  1. Moderate persistent asthma with exacerbation    Urgent nebulizer treatment needed: no.  Meds ordered this encounter  Medications  . albuterol (PROVENTIL HFA;VENTOLIN HFA) 108 (90 Base) MCG/ACT inhaler    Sig: Inhale 2 puffs into the lungs every 4 (four) hours as needed for wheezing or shortness of breath.    Dispense:  1 Inhaler    Refill:  1  . predniSONE (STERAPRED UNI-PAK 21 TAB) 10 MG (21) TBPK tablet    Sig: Take by mouth daily. Take as directed.    Dispense:  21 tablet    Refill:  0   Asthma precautions given. OTC symptom care as needed.  Follow-up Information    [facility].   Specialty:  Urgent Care Why:  As needed. Contact information: [address] [place] [address] [phone]       [facility] EMERGENCY DEPARTMENT.   Specialty:  Emergency Medicine Why:  If symptoms worsen. Contact information: [address] [id number] mc [place] [address] [phone]         Reviewed expectations re: course of current medical issues. Questions answered. Outlined signs and symptoms indicating need for more acute intervention. Patient verbalized understanding. After Visit Summary given.  SUBJECTIVE: History from: patient.  Barbara Peterson is a 22 y.o. female who presents with complaint of intermittent wheezing. Triggers: questions season/weather change; recent cold weather. Onset gradual, over the past few days. Also with mild nasal congestion and cough for the past few days. Describes wheezing as mild to moderate when present. Fever: no. Overall normal PO intake without n/v. Sick contacts: no. Typically her asthma is well controlled. Inhaler use: "ran out"; requests refill. OTC treatment: none.  Received flu shot this year: no.  Social History   Tobacco Use  Smoking Status Never  Smoker    ROS: As per HPI. All other systems negative.   OBJECTIVE:  Vitals:   03/31/18 1157  BP: 138/88  Pulse: 74  Resp: (!) 24  Temp: 98.2 F (36.8 C)  TempSrc: Oral  SpO2: 100%    Recheck RR: 20 General appearance: alert; appears fatigued HEENT: nasal congestion; mild throat irritation secondary to post-nasal drainage Neck: supple without LAD Cv: RRR without murmer Lungs: unlabored respirations, mild bilateral expiratory wheezing; cough: mild; no significant respiratory distress; able to speak full sentences Back: no tenderness Skin: warm and dry Psychological: alert and cooperative; normal mood and affect   Allergies  Allergen Reactions  . Citrus     Past Medical History:  Diagnosis Date  . Asthma   . Protein S deficiency (HCC)     Social History   Socioeconomic History  . Marital status: Single    Spouse name: Not on file  . Number of children: Not on file  . Years of education: Not on file  . Highest education level: Not on file  Occupational History  . Not on file  Social Needs  . Financial resource strain: Not on file  . Food insecurity:    Worry: Not on file    Inability: Not on file  . Transportation needs:    Medical: Not on file    Non-medical: Not on file  Tobacco Use  . Smoking status: Never Smoker  Substance and Sexual Activity  . Alcohol use: No  . Drug use: Not on file  . Sexual activity: Not on file  Lifestyle  . Physical activity:    Days per week: Not on file    Minutes per session: Not on file  . Stress: Not on file  Relationships  . Social connections:    Talks on phone: Not on file    Gets together: Not on file    Attends religious service: Not on file    Active member of club or organization: Not on file    Attends meetings of clubs or organizations: Not on file    Relationship status: Not on file  . Intimate partner violence:    Fear of current or ex partner: Not on file    Emotionally abused: Not on file     Physically abused: Not on file    Forced sexual activity: Not on file  Other Topics Concern  . Not on file  Social History Narrative  . Not on file            Mardella Layman, MD 03/31/18 1253

## 2018-03-31 NOTE — ED Triage Notes (Signed)
Pt reports a sinus infection for several days.  Today she started coughing at work and feeling her chest getting tight.  Pt has a history of asthma and does not have her inhalers.  Pt is in NAD at this time.

## 2018-08-24 ENCOUNTER — Emergency Department (HOSPITAL_COMMUNITY)
Admission: EM | Admit: 2018-08-24 | Discharge: 2018-08-24 | Disposition: A | Discharge status: Home / Self Care | Payer: BLUE CROSS/BLUE SHIELD | Attending: Emergency Medicine | Admitting: Emergency Medicine

## 2018-08-24 ENCOUNTER — Emergency Department (HOSPITAL_COMMUNITY): Payer: BLUE CROSS/BLUE SHIELD

## 2018-08-24 ENCOUNTER — Encounter (HOSPITAL_COMMUNITY): Payer: Self-pay | Admitting: Emergency Medicine

## 2018-08-24 DIAGNOSIS — J45909 Unspecified asthma, uncomplicated: Secondary | ICD-10-CM | POA: Insufficient documentation

## 2018-08-24 DIAGNOSIS — Y92410 Unspecified street and highway as the place of occurrence of the external cause: Secondary | ICD-10-CM | POA: Diagnosis not present

## 2018-08-24 DIAGNOSIS — Y998 Other external cause status: Secondary | ICD-10-CM | POA: Insufficient documentation

## 2018-08-24 DIAGNOSIS — S0990XA Unspecified injury of head, initial encounter: Secondary | ICD-10-CM | POA: Diagnosis not present

## 2018-08-24 DIAGNOSIS — T07XXXA Unspecified multiple injuries, initial encounter: Secondary | ICD-10-CM | POA: Diagnosis not present

## 2018-08-24 DIAGNOSIS — Y9389 Activity, other specified: Secondary | ICD-10-CM | POA: Insufficient documentation

## 2018-08-24 DIAGNOSIS — S0181XA Laceration without foreign body of other part of head, initial encounter: Secondary | ICD-10-CM

## 2018-08-24 LAB — CBC WITH DIFFERENTIAL/PLATELET
Abs Immature Granulocytes: 0.11 10*3/uL — ABNORMAL HIGH (ref 0.00–0.07)
Basophils Absolute: 0.1 10*3/uL (ref 0.0–0.1)
Basophils Relative: 0 %
Eosinophils Absolute: 0.3 10*3/uL (ref 0.0–0.5)
Eosinophils Relative: 2 %
HCT: 40.6 % (ref 36.0–46.0)
HEMOGLOBIN: 12.6 g/dL (ref 12.0–15.0)
Immature Granulocytes: 1 %
Lymphocytes Relative: 11 %
Lymphs Abs: 1.9 10*3/uL (ref 0.7–4.0)
MCH: 26.8 pg (ref 26.0–34.0)
MCHC: 31 g/dL (ref 30.0–36.0)
MCV: 86.2 fL (ref 80.0–100.0)
Monocytes Absolute: 1.3 10*3/uL — ABNORMAL HIGH (ref 0.1–1.0)
Monocytes Relative: 7 %
Neutro Abs: 13.6 10*3/uL — ABNORMAL HIGH (ref 1.7–7.7)
Neutrophils Relative %: 79 %
Platelets: 178 10*3/uL (ref 150–400)
RBC: 4.71 MIL/uL (ref 3.87–5.11)
RDW: 13.6 % (ref 11.5–15.5)
WBC: 17.2 10*3/uL — ABNORMAL HIGH (ref 4.0–10.5)
nRBC: 0 % (ref 0.0–0.2)

## 2018-08-24 LAB — I-STAT BETA HCG BLOOD, ED (MC, WL, AP ONLY): I-stat hCG, quantitative: <5 m[IU]/mL (ref <5)

## 2018-08-24 LAB — ETHANOL: Alcohol, Ethyl (B): <10 mg/dL (ref <10)

## 2018-08-24 LAB — COMPREHENSIVE METABOLIC PANEL
ALBUMIN: 4 g/dL (ref 3.5–5.0)
ALT: 23 U/L (ref 0–44)
AST: 45 U/L — ABNORMAL HIGH (ref 15–41)
Alkaline Phosphatase: 40 U/L (ref 38–126)
Anion gap: 8 (ref 5–15)
BUN: 11 mg/dL (ref 6–20)
CALCIUM: 8.9 mg/dL (ref 8.9–10.3)
CO2: 18 mmol/L — ABNORMAL LOW (ref 22–32)
Chloride: 109 mmol/L (ref 98–111)
Creatinine, Ser: 0.87 mg/dL (ref 0.44–1.00)
GFR calc Af Amer: >60 mL/min (ref >60)
GFR calc non Af Amer: >60 mL/min (ref >60)
Glucose, Bld: 124 mg/dL — ABNORMAL HIGH (ref 70–99)
Potassium: 4.1 mmol/L (ref 3.5–5.1)
SODIUM: 135 mmol/L (ref 135–145)
Total Bilirubin: 1 mg/dL (ref 0.3–1.2)
Total Protein: 6.7 g/dL (ref 6.5–8.1)

## 2018-08-24 MED ORDER — SODIUM CHLORIDE 0.9 % IV SOLN
INTRAVENOUS | Status: DC
Start: 2018-08-24 — End: 2018-08-24
  Administered 2018-08-24: 06:00:00 via INTRAVENOUS

## 2018-08-24 MED ORDER — TETANUS-DIPHTH-ACELL PERTUSSIS 5-2.5-18.5 LF-MCG/0.5 IM SUSP: 0.5000 mL | Freq: Once | INTRAMUSCULAR | Status: DC

## 2018-08-24 MED ORDER — IBUPROFEN 800 MG PO TABS: 800.0000 mg | ORAL_TABLET | Freq: Three times a day (TID) | ORAL | 0 refills | Status: DC | PRN

## 2018-08-24 MED ORDER — ONDANSETRON 4 MG PO TBDP
4.0000 mg | ORAL_TABLET | Freq: Once | ORAL | Status: AC
  Administered 2018-08-24: 4 mg via ORAL
  Filled 2018-08-24: qty 1

## 2018-08-24 MED ORDER — FENTANYL CITRATE (PF) 100 MCG/2ML IJ SOLN
50.0000 ug | Freq: Once | INTRAMUSCULAR | Status: AC
  Administered 2018-08-24: 50 ug via INTRAVENOUS
  Filled 2018-08-24: qty 2

## 2018-08-24 MED ORDER — HYDROCODONE-ACETAMINOPHEN 5-325 MG PO TABS: 2.0000 | ORAL_TABLET | Freq: Four times a day (QID) | ORAL | 0 refills | Status: DC | PRN

## 2018-08-24 MED ORDER — LIDOCAINE HCL (PF) 1 % IJ SOLN
5.0000 mL | Freq: Once | INTRAMUSCULAR | Status: DC
  Filled 2018-08-24: qty 5

## 2018-08-24 MED ORDER — ONDANSETRON 4 MG PO TBDP: 4.0000 mg | ORAL_TABLET | Freq: Four times a day (QID) | ORAL | 0 refills | Status: DC | PRN

## 2018-08-24 MED ORDER — ONDANSETRON HCL 4 MG/2ML IJ SOLN
4.0000 mg | Freq: Once | INTRAMUSCULAR | Status: AC
  Administered 2018-08-24: 4 mg via INTRAVENOUS
  Filled 2018-08-24: qty 2
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
## 2018-08-24 NOTE — ED Notes (Signed)
Delay in lab draw,  Pt not in room at this time. 

## 2018-08-24 NOTE — ED Provider Notes (Signed)
LACERATION REPAIR Performed by: Claude Manges Authorized by: Claude Manges Consent: Verbal consent obtained. Risks and benefits: risks, benefits and alternatives were discussed Consent given by: patient Patient identity confirmed: provided demographic data Prepped and Draped in normal sterile fashion Wound explored  Laceration Location: right eyebrow   Laceration Length: 3 cm  No Foreign Bodies seen or palpated  Anesthesia: local infiltration  Local anesthetic: lidocaine 1% wihtout epinephrine  Anesthetic total: 2 ml  Irrigation method: syringe Amount of cleaning: standard  Skin closure: prolene sutures  Number of sutures: 4  Technique: simple interrupted   Patient tolerance: Patient tolerated the procedure well with no immediate complications.     Claude Manges, PA-C 08/24/18 7034    Ward, Layla Maw, DO 08/24/18 0352
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
## 2018-08-24 NOTE — ED Notes (Signed)
Transported to [facility]

## 2018-08-24 NOTE — ED Notes (Signed)
Pt ambulated with no concerns at this time.

## 2018-08-31 ENCOUNTER — Encounter (HOSPITAL_COMMUNITY): Payer: Self-pay | Admitting: *Deleted

## 2018-08-31 ENCOUNTER — Other Ambulatory Visit: Payer: Self-pay

## 2018-08-31 ENCOUNTER — Emergency Department (HOSPITAL_COMMUNITY)
Admission: EM | Admit: 2018-08-31 | Discharge: 2018-08-31 | Disposition: A | Discharge status: Home / Self Care | Payer: BLUE CROSS/BLUE SHIELD | Attending: Emergency Medicine | Admitting: Emergency Medicine

## 2018-08-31 DIAGNOSIS — Z79899 Other long term (current) drug therapy: Secondary | ICD-10-CM | POA: Diagnosis not present

## 2018-08-31 DIAGNOSIS — J45909 Unspecified asthma, uncomplicated: Secondary | ICD-10-CM | POA: Diagnosis not present

## 2018-08-31 DIAGNOSIS — Z4802 Encounter for removal of sutures: Secondary | ICD-10-CM | POA: Insufficient documentation

## 2018-08-31 NOTE — Discharge Instructions (Addendum)
Contact a health care provider if:  You have redness, swelling, or pain around your wound.  You have fluid or blood coming from your wound.  Your wound feels warm to the touch.  You have pus or a bad smell coming from your wound.  Your wound opens up.  Get help right away if:  You have a fever.  You have redness that is spreading from your wound.

## 2018-08-31 NOTE — ED Triage Notes (Signed)
Here for suture removal at right eyebrow.

## 2018-08-31 NOTE — ED Provider Notes (Signed)
[facility] EMERGENCY DEPARTMENT Provider Note   CSN: [id number] Arrival date & time: 08/31/18  7340    History   Chief Complaint Chief Complaint  Patient presents with  . Suture / Staple Removal    HPI Barbara Peterson is a 23 y.o. female. Patient presents for suture removal. No complaints of pain, bleeding or purulence. No fevers or chills     HPI  Past Medical History:  Diagnosis Date  . Asthma   . Protein S deficiency (HCC)     There are no active problems to display for this patient.   Past Surgical History:  Procedure Laterality Date  . WISDOM TOOTH EXTRACTION       OB History   No obstetric history on file.      Home Medications    Prior to Admission medications   Medication Sig Start Date End Date Taking? Authorizing Provider  albuterol (PROVENTIL HFA;VENTOLIN HFA) 108 (90 Base) MCG/ACT inhaler Inhale 2 puffs into the lungs every 4 (four) hours as needed for wheezing or shortness of breath. 03/31/18   Mardella Layman, MD  fluticasone (FLONASE) 50 MCG/ACT nasal spray Place into both nostrils daily.    [provider]  HYDROcodone-acetaminophen (NORCO/VICODIN) 5-325 MG tablet Take 2 tablets by mouth every 6 (six) hours as needed. 08/24/18   Ward, Layla Maw, DO  ibuprofen (ADVIL,MOTRIN) 800 MG tablet Take 1 tablet (800 mg total) by mouth every 8 (eight) hours as needed for mild pain. 08/24/18   Ward, Layla Maw, DO  ondansetron (ZOFRAN ODT) 4 MG disintegrating tablet Take 1 tablet (4 mg total) by mouth every 6 (six) hours as needed. 08/24/18   Ward, Layla Maw, DO  predniSONE (STERAPRED UNI-PAK 21 TAB) 10 MG (21) TBPK tablet Take by mouth daily. Take as directed. 03/31/18   Mardella Layman, MD    Family History No family history on file.  Social History Social History   Tobacco Use  . Smoking status: Never Smoker  . Smokeless tobacco: Never Used  Substance Use Topics  . Alcohol use: No  . Drug use: Not on file     Allergies    Citrus   Review of Systems Review of Systems  Constitutional: Positive for chills and fever.  Skin: Positive for wound.     Physical Exam Updated Vital Signs BP 120/86 (BP Location: Right Arm)   Pulse 64   Temp 99 F (37.2 C) (Oral)   Resp 18   Ht 5\' 3"  (1.6 m)   Wt 53.2 kg   SpO2 98%   BMI 20.78 kg/m   Physical Exam Vitals signs and nursing note reviewed.  Constitutional:      General: She is not in acute distress.    Appearance: She is well-developed. She is not diaphoretic.  HENT:     Head: Normocephalic.     Comments: Well-healing laceration with 4 sutures in the right eyebrow Eyes:     General: No scleral icterus.    Conjunctiva/sclera: Conjunctivae normal.  Neck:     Musculoskeletal: Normal range of motion.  Cardiovascular:     Rate and Rhythm: Normal rate and regular rhythm.     Heart sounds: Normal heart sounds. No murmur. No friction rub. No gallop.   Pulmonary:     Effort: Pulmonary effort is normal. No respiratory distress.     Breath sounds: Normal breath sounds.  Abdominal:     General: Bowel sounds are normal. There is no distension.     Palpations:  Abdomen is soft. There is no mass.     Tenderness: There is no abdominal tenderness. There is no guarding.  Skin:    General: Skin is warm and dry.  Neurological:     Mental Status: She is alert and oriented to person, place, and time.  Psychiatric:        Behavior: Behavior normal.      ED Treatments / Results  Labs (all labs ordered are listed, but only abnormal results are displayed) Labs Reviewed - No data to display  EKG None  Radiology No results found.  Procedures Procedures (including critical care time)  SUTURE REMOVAL Performed by: Arthor Captain  Consent: Verbal consent obtained. Patient identity confirmed: provided demographic data Time out: Immediately prior to procedure a "time out" was called to verify the correct patient, procedure, equipment, support staff and  site/side marked as required.  Location details: R eyebrow  Wound Appearance: clean  Sutures/Staples Removed: 4  Facility: sutures placed in this facility Patient tolerance: Patient tolerated the procedure well with no immediate complications.    Medications Ordered in ED Medications - No data to display   Initial Impression / Assessment and Plan / ED Course  I have reviewed the triage vital signs and the nursing notes.  Pertinent labs & imaging results that were available during my care of the patient were reviewed by me and considered in my medical decision making (see chart for details).      Staple removal   Pt to ER for staple/suture removal and wound check as above. Procedure tolerated well. Vitals normal, no signs of infection. Scar minimization & return precautions given at dc.        Final Clinical Impressions(s) / ED Diagnoses   Final diagnoses:  Visit for suture removal    ED Discharge Orders    None       Arthor Captain, PA-C 08/31/18 1008    Sabas Sous, MD 08/31/18 [phone]

## 2018-08-31 NOTE — ED Notes (Signed)
Pt verbalized understanding of discharge paperwork.

## 2019-01-15 ENCOUNTER — Ambulatory Visit (INDEPENDENT_AMBULATORY_CARE_PROVIDER_SITE_OTHER)
Admission: RE | Admit: 2019-01-15 | Discharge: 2019-01-15 | Disposition: A | Discharge status: Home / Self Care | Payer: BC Managed Care – PPO | Source: Ambulatory Visit

## 2019-01-15 DIAGNOSIS — N898 Other specified noninflammatory disorders of vagina: Secondary | ICD-10-CM | POA: Diagnosis not present

## 2019-01-15 MED ORDER — METRONIDAZOLE 500 MG PO TABS: 500.0000 mg | ORAL_TABLET | Freq: Two times a day (BID) | ORAL | 0 refills | Status: DC

## 2019-01-15 NOTE — Discharge Instructions (Signed)
Treating you for BV based on hx and symptoms  Follow up as needed for continued or worsening symptoms

## 2019-01-15 NOTE — ED Provider Notes (Signed)
Virtual Visit via Video Note:  Barbara Peterson  initiated request for Telemedicine visit with [facility] Urgent Care team. I connected with Barbara Peterson  on 01/15/2019 at 9:09 AM  for a synchronized telemedicine visit using a video enabled HIPPA compliant telemedicine application. I verified that I am speaking with Barbara Peterson  using two identifiers. Orvan July, NP  was physically located in a [facility] Urgent care site and Barbara Peterson was located at a different location.   The limitations of evaluation and management by telemedicine as well as the availability of in-person appointments were discussed. Patient was informed that she  may incur a bill ( including co-pay) for this virtual visit encounter. Barbara Peterson  expressed understanding and gave verbal consent to proceed with virtual visit.     History of Present Illness:Barbara Peterson  is a 23 y.o. female presents with vaginal discharge.  This has been present for the past 3 days.  Reporting white, creamy discharge with foul odor.  Consistent with previous bacterial vaginosis infections. Currently not sexually active.  She has been tested for STDs recently.  Denies any abdominal pain, dysuria or hematuria.  Denies any fevers.  No itching or irritation. LMP ended 3 days ago.     Past Medical History:  Diagnosis Date  . Asthma   . Protein S deficiency ([place])     Allergies  Allergen Reactions  . Citrus         Observations/Objective:VITALS: Per patient if applicable, see vitals. GENERAL: Alert, appears well and in no acute distress. HEENT: Atraumatic, conjunctiva clear, no obvious abnormalities on inspection of external nose and ears. NECK: Normal movements of the head and neck. CARDIOPULMONARY: No increased WOB. Speaking in clear sentences. I:E ratio WNL.  MS: Moves all visible extremities without noticeable abnormality. PSYCH: Pleasant and cooperative, well-groomed. Speech normal rate and rhythm. Affect is appropriate. Insight  and judgement are appropriate. Attention is focused, linear, and appropriate.  NEURO: CN grossly intact. Oriented as arrived to appointment on time with no prompting. Moves both UE equally.  SKIN: No obvious lesions, wounds, erythema, or cyanosis noted on face or hands.    Assessment and Plan: Treating for bacterial vaginosis with Flagyl    Follow Up Instructions:Follow up as needed for continued or worsening symptoms     I discussed the assessment and treatment plan with the patient. The patient was provided an opportunity to ask questions and all were answered. The patient agreed with the plan and demonstrated an understanding of the instructions.   The patient was advised to call back or seek an in-person evaluation if the symptoms worsen or if the condition fails to improve as anticipated.      Orvan July, NP  01/15/2019 9:09 AM         Orvan July, NP 01/15/19 [phone]

## 2019-02-26 ENCOUNTER — Encounter (HOSPITAL_COMMUNITY): Payer: Self-pay | Admitting: Emergency Medicine

## 2019-02-26 ENCOUNTER — Emergency Department (HOSPITAL_COMMUNITY)
Admission: EM | Admit: 2019-02-26 | Discharge: 2019-02-26 | Disposition: A | Discharge status: Home / Self Care | Payer: BC Managed Care – PPO | Attending: Emergency Medicine | Admitting: Emergency Medicine

## 2019-02-26 ENCOUNTER — Other Ambulatory Visit: Payer: Self-pay

## 2019-02-26 DIAGNOSIS — Y939 Activity, unspecified: Secondary | ICD-10-CM | POA: Insufficient documentation

## 2019-02-26 DIAGNOSIS — M542 Cervicalgia: Secondary | ICD-10-CM | POA: Diagnosis present

## 2019-02-26 DIAGNOSIS — Y929 Unspecified place or not applicable: Secondary | ICD-10-CM | POA: Insufficient documentation

## 2019-02-26 DIAGNOSIS — S161XXA Strain of muscle, fascia and tendon at neck level, initial encounter: Secondary | ICD-10-CM | POA: Diagnosis not present

## 2019-02-26 DIAGNOSIS — Y999 Unspecified external cause status: Secondary | ICD-10-CM | POA: Insufficient documentation

## 2019-02-26 MED ORDER — ONDANSETRON HCL 4 MG PO TABS
4.0000 mg | ORAL_TABLET | Freq: Once | ORAL | Status: AC
  Administered 2019-02-26: 4 mg via ORAL
  Filled 2019-02-26: qty 1

## 2019-02-26 MED ORDER — CYCLOBENZAPRINE HCL 5 MG PO TABS: 5.0000 mg | ORAL_TABLET | Freq: Two times a day (BID) | ORAL | 0 refills | Status: AC | PRN

## 2019-02-26 MED ORDER — KETOROLAC TROMETHAMINE 30 MG/ML IJ SOLN
30.0000 mg | Freq: Once | INTRAMUSCULAR | Status: AC
  Administered 2019-02-26: 30 mg via INTRAMUSCULAR
  Filled 2019-02-26: qty 1
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
## 2019-02-26 NOTE — ED Notes (Signed)
Pt. Verbalizes understanding of admission and prescriptions. Opportunity for questions and answers provided.

## 2019-02-26 NOTE — Discharge Instructions (Addendum)
Please read attached information. If you experience any new or worsening signs or symptoms please return to the emergency room for evaluation. Please follow-up with your primary care provider or specialist as discussed. Please use medication prescribed only as directed and discontinue taking if you have any concerning signs or symptoms.   °
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
## 2019-03-25 ENCOUNTER — Encounter (HOSPITAL_COMMUNITY): Payer: Self-pay | Admitting: Emergency Medicine

## 2019-03-25 ENCOUNTER — Emergency Department (HOSPITAL_COMMUNITY)
Admission: EM | Admit: 2019-03-25 | Discharge: 2019-03-25 | Discharge status: Against Medical Advice | Payer: BC Managed Care – PPO | Attending: Emergency Medicine | Admitting: Emergency Medicine

## 2019-03-25 ENCOUNTER — Other Ambulatory Visit: Payer: Self-pay

## 2019-03-25 DIAGNOSIS — Z5321 Procedure and treatment not carried out due to patient leaving prior to being seen by health care provider: Secondary | ICD-10-CM | POA: Insufficient documentation

## 2019-03-25 DIAGNOSIS — R1012 Left upper quadrant pain: Secondary | ICD-10-CM | POA: Diagnosis present

## 2019-03-25 LAB — CBC
HCT: 40.7 % (ref 36.0–46.0)
Hemoglobin: 13.4 g/dL (ref 12.0–15.0)
MCH: 28.6 pg (ref 26.0–34.0)
MCHC: 32.9 g/dL (ref 30.0–36.0)
MCV: 86.8 fL (ref 80.0–100.0)
Platelets: 237 10*3/uL (ref 150–400)
RBC: 4.69 MIL/uL (ref 3.87–5.11)
RDW: 13.2 % (ref 11.5–15.5)
WBC: 6.9 10*3/uL (ref 4.0–10.5)
nRBC: 0 % (ref 0.0–0.2)

## 2019-03-25 LAB — URINALYSIS, ROUTINE W REFLEX MICROSCOPIC
Bilirubin Urine: NEGATIVE
Glucose, UA: NEGATIVE mg/dL
Ketones, ur: NEGATIVE mg/dL
Leukocytes,Ua: NEGATIVE
Nitrite: NEGATIVE
Protein, ur: NEGATIVE mg/dL
Specific Gravity, Urine: 1.026 (ref 1.005–1.030)
pH: 7 (ref 5.0–8.0)

## 2019-03-25 LAB — COMPREHENSIVE METABOLIC PANEL
ALT: 15 U/L (ref 0–44)
AST: 18 U/L (ref 15–41)
Albumin: 4.2 g/dL (ref 3.5–5.0)
Alkaline Phosphatase: 50 U/L (ref 38–126)
Anion gap: 11 (ref 5–15)
BUN: 11 mg/dL (ref 6–20)
CO2: 23 mmol/L (ref 22–32)
Calcium: 9.3 mg/dL (ref 8.9–10.3)
Chloride: 104 mmol/L (ref 98–111)
Creatinine, Ser: 0.74 mg/dL (ref 0.44–1.00)
GFR calc Af Amer: >60 mL/min (ref >60)
GFR calc non Af Amer: >60 mL/min (ref >60)
Glucose, Bld: 108 mg/dL — ABNORMAL HIGH (ref 70–99)
Potassium: 3.7 mmol/L (ref 3.5–5.1)
Sodium: 138 mmol/L (ref 135–145)
Total Bilirubin: 0.8 mg/dL (ref 0.3–1.2)
Total Protein: 7.1 g/dL (ref 6.5–8.1)

## 2019-03-25 LAB — I-STAT BETA HCG BLOOD, ED (MC, WL, AP ONLY): I-stat hCG, quantitative: <5 m[IU]/mL (ref <5)

## 2019-03-25 LAB — LIPASE, BLOOD: Lipase: 28 U/L (ref 11–51)

## 2019-03-25 MED ORDER — SODIUM CHLORIDE 0.9% FLUSH: 3.0000 mL | Freq: Once | INTRAVENOUS | Status: DC

## 2019-03-25 NOTE — ED Notes (Signed)
Pt did not respond when called to be placed in her room. Pt not in ED lobby or outside the ED

## 2019-03-25 NOTE — ED Triage Notes (Signed)
Pt reports left upper quad abd pain since last Friday, sharp in nature.  Comes and goes, now is dull, pt also reports nausea.

## 2019-08-27 ENCOUNTER — Ambulatory Visit: Payer: BC Managed Care – PPO | Attending: Family

## 2019-08-27 DIAGNOSIS — Z23 Encounter for immunization: Secondary | ICD-10-CM

## 2019-08-27 NOTE — Progress Notes (Signed)
   Covid-19 Vaccination Clinic  Name:  Barbara Peterson    MRN: [id number] DOB: [birth year]  08/27/2019  [name] was observed post Covid-19 immunization for 15 minutes without incident. She was provided with Vaccine Information Sheet and instruction to access the V-Safe system.   [name] was instructed to call 911 with any severe reactions post vaccine: Marland Kitchen Difficulty breathing  . Swelling of face and throat  . A fast heartbeat  . A bad rash all over body  . Dizziness and weakness   Immunizations Administered    Name Date Dose VIS Date Route   Moderna COVID-19 Vaccine 08/27/2019  2:13 PM 0.5 mL 05/19/2019 Intramuscular   Manufacturer: Moderna   Lot: [id number]   NDC: 23953-202-33

## 2019-09-29 ENCOUNTER — Ambulatory Visit: Payer: Self-pay | Attending: Family

## 2019-09-29 DIAGNOSIS — Z23 Encounter for immunization: Secondary | ICD-10-CM

## 2019-09-29 NOTE — Progress Notes (Signed)
   Covid-19 Vaccination Clinic  Name:  Barbara Peterson    MRN: [id number] DOB: 1995-10-16  09/29/2019  Barbara Peterson was observed post Covid-19 immunization for 30 minutes based on pre-vaccination screening without incident. She was provided with Vaccine Information Sheet and instruction to access the V-Safe system.   Barbara Peterson was instructed to call 911 with any severe reactions post vaccine: Marland Kitchen Difficulty breathing  . Swelling of face and throat  . A fast heartbeat  . A bad rash all over body  . Dizziness and weakness   Immunizations Administered    Name Date Dose VIS Date Route   Moderna COVID-19 Vaccine 09/29/2019 11:34 AM 0.5 mL 05/19/2019 Intramuscular   Manufacturer: Moderna   Lot: [id number]   NDC: 82500-370-48

## 2020-04-24 ENCOUNTER — Encounter (HOSPITAL_COMMUNITY): Payer: Self-pay

## 2020-04-24 ENCOUNTER — Ambulatory Visit (HOSPITAL_COMMUNITY): Admit: 2020-04-24 | Discharge status: Absent without leave | Payer: No Typology Code available for payment source

## 2020-04-24 ENCOUNTER — Other Ambulatory Visit: Payer: Self-pay

## 2020-04-24 ENCOUNTER — Ambulatory Visit (HOSPITAL_COMMUNITY)
Admission: EM | Admit: 2020-04-24 | Discharge: 2020-04-24 | Disposition: A | Discharge status: Home / Self Care | Payer: HRSA Program | Attending: Family Medicine | Admitting: Family Medicine

## 2020-04-24 DIAGNOSIS — Z20822 Contact with and (suspected) exposure to covid-19: Secondary | ICD-10-CM | POA: Insufficient documentation

## 2020-04-24 DIAGNOSIS — R0602 Shortness of breath: Secondary | ICD-10-CM | POA: Insufficient documentation

## 2020-04-24 DIAGNOSIS — R062 Wheezing: Secondary | ICD-10-CM

## 2020-04-24 DIAGNOSIS — J4530 Mild persistent asthma, uncomplicated: Secondary | ICD-10-CM

## 2020-04-24 DIAGNOSIS — J069 Acute upper respiratory infection, unspecified: Secondary | ICD-10-CM | POA: Diagnosis not present

## 2020-04-24 LAB — SARS CORONAVIRUS 2 (TAT 6-24 HRS): SARS Coronavirus 2: NEGATIVE

## 2020-04-24 MED ORDER — PROMETHAZINE-DM 6.25-15 MG/5ML PO SYRP: 5.0000 mL | ORAL_SOLUTION | Freq: Every evening | ORAL | 0 refills | Status: AC | PRN

## 2020-04-24 MED ORDER — PREDNISONE 20 MG PO TABS: ORAL_TABLET | ORAL | 0 refills | Status: AC

## 2020-04-24 MED ORDER — AEROCHAMBER PLUS FLO-VU MEDIUM MISC
1.0000 | Freq: Once | Status: AC
  Administered 2020-04-24: 1

## 2020-04-24 MED ORDER — ALBUTEROL SULFATE HFA 108 (90 BASE) MCG/ACT IN AERS
INHALATION_SPRAY | RESPIRATORY_TRACT | Status: AC
  Filled 2020-04-24: qty 6.7

## 2020-04-24 MED ORDER — BENZONATATE 100 MG PO CAPS: 100.0000 mg | ORAL_CAPSULE | Freq: Three times a day (TID) | ORAL | 0 refills | Status: AC | PRN

## 2020-04-24 MED ORDER — AEROCHAMBER PLUS FLO-VU LARGE MISC
Status: AC
  Filled 2020-04-24: qty 1

## 2020-04-24 MED ORDER — ALBUTEROL SULFATE HFA 108 (90 BASE) MCG/ACT IN AERS
2.0000 | INHALATION_SPRAY | Freq: Once | RESPIRATORY_TRACT | Status: AC
  Administered 2020-04-24: 2 via RESPIRATORY_TRACT

## 2020-04-24 NOTE — ED Provider Notes (Signed)
[facility]   MRN: [id number] DOB: [birth year]  Subjective:   Barbara Peterson is a 24 y.o. female presenting for acute onset today of significant shortness of breath, has actually had a cough, runny and stuffy nose, throat pain for the past few days.  Was around a lot of dust today.  Has a history of asthma but is not using her inhaler, last use was about 6 months ago.  Denies loss of sense of taste and smell, chest pain.  Patient is COVID vaccinated.   No current facility-administered medications for this encounter.  Current Outpatient Medications:  .  dicyclomine (BENTYL) 10 MG capsule, Take by mouth., Disp: , Rfl:  .  norethindrone (MICRONOR) 0.35 MG tablet, Take 1 tablet by mouth daily., Disp: , Rfl:  .  albuterol (PROVENTIL HFA;VENTOLIN HFA) 108 (90 Base) MCG/ACT inhaler, Inhale 2 puffs into the lungs every 4 (four) hours as needed for wheezing or shortness of breath., Disp: 1 Inhaler, Rfl: 1 .  busPIRone (BUSPAR) 10 MG tablet, Take by mouth., Disp: , Rfl:  .  cyclobenzaprine (FLEXERIL) 5 MG tablet, Take 1 tablet (5 mg total) by mouth 2 (two) times daily as needed for muscle spasms., Disp: 30 tablet, Rfl: 0 .  fluticasone (FLONASE) 50 MCG/ACT nasal spray, Place into both nostrils daily., Disp: , Rfl:  .  Fluticasone-Salmeterol (ADVAIR DISKUS) 100-50 MCG/DOSE AEPB, Advair Diskus 100 mcg-50 mcg/dose powder for inhalation  INL 2 PFS PO D, Disp: , Rfl:  .  metroNIDAZOLE (FLAGYL) 500 MG tablet, Take 1 tablet (500 mg total) by mouth 2 (two) times daily., Disp: 14 tablet, Rfl: 0 .  montelukast (SINGULAIR) 10 MG tablet, Take by mouth., Disp: , Rfl:    Allergies  Allergen Reactions  . Iodinated Diagnostic Agents     Chest tightness, cough and runny nose and shivering  . Citrus     Past Medical History:  Diagnosis Date  . Asthma   . Protein S deficiency [facility])      Past Surgical History:  Procedure Laterality Date  . WISDOM TOOTH EXTRACTION      History reviewed. No  pertinent family history.  Social History   Tobacco Use  . Smoking status: Never Smoker  . Smokeless tobacco: Never Used  Substance Use Topics  . Alcohol use: No  . Drug use: Not on file    ROS   Objective:   Vitals: BP (!) 126/91 (BP Location: Right Arm)   Pulse 88   Temp 98 F (36.7 C) (Oral)   Resp (!) 24   LMP 04/12/2020 (Approximate)   SpO2 100%   Physical Exam Constitutional:      General: She is not in acute distress.    Appearance: Normal appearance. She is well-developed. She is not ill-appearing, toxic-appearing or diaphoretic.  HENT:     Head: Normocephalic and atraumatic.     Nose: Nose normal.     Mouth/Throat:     Mouth: Mucous membranes are moist.  Eyes:     General: No scleral icterus.       Right eye: No discharge.        Left eye: No discharge.     Extraocular Movements: Extraocular movements intact.     Conjunctiva/sclera: Conjunctivae normal.     Pupils: Pupils are equal, round, and reactive to light.  Cardiovascular:     Rate and Rhythm: Normal rate and regular rhythm.     Pulses: Normal pulses.     Heart sounds: Normal  heart sounds. No murmur heard.  No friction rub. No gallop.   Pulmonary:     Effort: Pulmonary effort is normal. No respiratory distress.     Breath sounds: No stridor. Examination of the right-middle field reveals wheezing. Examination of the right-lower field reveals wheezing. Examination of the left-lower field reveals wheezing. Wheezing present. No rhonchi or rales.  Skin:    General: Skin is warm and dry.     Findings: No rash.  Neurological:     Mental Status: She is alert and oriented to person, place, and time.  Psychiatric:        Mood and Affect: Mood normal.        Behavior: Behavior normal.        Thought Content: Thought content normal.        Judgment: Judgment normal.    Patient provided with an albuterol inhaler and a spacer in clinic with dramatic improvement in her breathing.  She still had wheezing  as above.  Assessment and Plan :   PDMP not reviewed this encounter.  1. Shortness of breath   2. Wheezing   3. Mild persistent asthma without complication   4. Viral URI with cough     COVID-19 testing pending.  Recommended oral prednisone course, scheduling albuterol inhaler, cough suppression medications and general supportive care. Counseled patient on potential for adverse effects with medications prescribed/recommended today, ER and return-to-clinic precautions discussed, patient verbalized understanding.    Wallis Bamberg, PA-C 04/24/20 1726

## 2020-04-24 NOTE — ED Triage Notes (Signed)
Pt c/o SOB onset 15 min PTA, pt states she was exposed to "dusty" environment today.  Also reports URI symptoms of productive cough, congestion, runny nose with green sputum, sore throat x3 days.  Denies fever, v/d. Did not take rescue inhaler for symptoms-last used 6 months ago. Has been taking dayquil and sudafed for URI. Bilateral wheezes throughout. Able to speak short sentences. Notified R. Lane, [place] of pt status, orders for albuterol inhaler w/spacer received.

## 2020-04-24 NOTE — Discharge Instructions (Signed)

## 2020-05-06 ENCOUNTER — Ambulatory Visit: Payer: No Typology Code available for payment source | Attending: Family

## 2020-05-06 DIAGNOSIS — Z23 Encounter for immunization: Secondary | ICD-10-CM
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
## 2020-08-23 NOTE — Progress Notes (Signed)
   Covid-19 Vaccination Clinic  Name:  Ariauna Farabee    MRN: [id number] DOB: [birth year]  08/23/2020  Ms. Mesina was observed post Covid-19 immunization for 15 minutes without incident. She was provided with Vaccine Information Sheet and instruction to access the V-Safe system.   Ms. Rayson was instructed to call 911 with any severe reactions post vaccine: Marland Kitchen Difficulty breathing  . Swelling of face and throat  . A fast heartbeat  . A bad rash all over body  . Dizziness and weakness   Immunizations Administered    Name Date Dose VIS Date Route   Pfizer COVID-19 Vaccine 05/06/2020  9:30 AM 0.3 mL 04/06/2020 Intramuscular   Manufacturer: [employer], [employer]   Lot: [id number]   NDC: [address]-0

## 2021-06-20 ENCOUNTER — Other Ambulatory Visit: Payer: Self-pay | Admitting: Physician Assistant

## 2021-06-20 DIAGNOSIS — R1013 Epigastric pain: Secondary | ICD-10-CM

## 2021-06-21 ENCOUNTER — Ambulatory Visit
Admission: RE | Admit: 2021-06-21 | Discharge: 2021-06-21 | Disposition: A | Discharge status: Home / Self Care | Payer: BC Managed Care – PPO | Source: Ambulatory Visit | Attending: Physician Assistant | Admitting: Physician Assistant

## 2021-06-21 DIAGNOSIS — R1013 Epigastric pain: Secondary | ICD-10-CM

## 2021-07-30 ENCOUNTER — Other Ambulatory Visit: Payer: Self-pay

## 2021-07-30 ENCOUNTER — Emergency Department
Admission: RE | Admit: 2021-07-30 | Discharge: 2021-07-30 | Disposition: A | Discharge status: Home / Self Care | Payer: BC Managed Care – PPO | Source: Ambulatory Visit

## 2021-07-30 VITALS — BP 124/88 | HR 66 | Temp 99.1°F | Resp 14 | Wt 118.0 lb

## 2021-07-30 DIAGNOSIS — L2082 Flexural eczema: Secondary | ICD-10-CM | POA: Diagnosis not present

## 2021-07-30 MED ORDER — HYDROCORTISONE PROBUTATE 0.1 % EX CREA: TOPICAL_CREAM | CUTANEOUS | 0 refills | Status: AC

## 2021-07-30 NOTE — ED Triage Notes (Signed)
Pt presents with "dry skin" around her mouth and eyes x 2.5 weeks. Pt states dry areas are itchy. Pt has used Aquaphor with no relief.

## 2021-07-30 NOTE — ED Provider Notes (Signed)
[facility]    CSN: [id number] Arrival date & time: 07/30/21  0951      History   Chief Complaint Chief Complaint  Patient presents with   Rash    HPI Barbara Peterson is a 26 y.o. female.   HPI patient presents with dry skin around her mouth and eyes for 2.5 weeks.  Patient reports these areas are itchy tried OTC Aquaphor with little to no relief.  Past Medical History:  Diagnosis Date   Asthma    Protein S deficiency ([facility])     There are no problems to display for this patient.   Past Surgical History:  Procedure Laterality Date   WISDOM TOOTH EXTRACTION      OB History   No obstetric history on file.      Home Medications    Prior to Admission medications   Medication Sig Start Date End Date Taking? Authorizing Provider  Hydrocortisone Probutate 0.1 % CREA Apply topically to affected areas of face twice daily, as needed 07/30/21  Yes Trevor Iha, FNP  albuterol (PROVENTIL HFA;VENTOLIN HFA) 108 (90 Base) MCG/ACT inhaler Inhale 2 puffs into the lungs every 4 (four) hours as needed for wheezing or shortness of breath. 03/31/18   Mardella Layman, MD  benzonatate (TESSALON) 100 MG capsule Take 1-2 capsules (100-200 mg total) by mouth 3 (three) times daily as needed. Patient not taking: Reported on 07/30/2021 04/24/20   Wallis Bamberg, PA-C  busPIRone (BUSPAR) 10 MG tablet Take by mouth. Patient not taking: Reported on 07/30/2021    [provider]  cyclobenzaprine (FLEXERIL) 5 MG tablet Take 1 tablet (5 mg total) by mouth 2 (two) times daily as needed for muscle spasms. Patient not taking: Reported on 07/30/2021 02/26/19   Hedges, Tinnie Gens, PA-C  dicyclomine (BENTYL) 10 MG capsule Take by mouth. Patient not taking: Reported on 07/30/2021 03/26/16   [provider]  fluticasone (FLONASE) 50 MCG/ACT nasal spray Place into both nostrils daily.    [provider]  Fluticasone-Salmeterol (ADVAIR DISKUS) 100-50 MCG/DOSE AEPB Advair Diskus 100  mcg-50 mcg/dose powder for inhalation  INL 2 PFS PO D    [provider]  metroNIDAZOLE (FLAGYL) 500 MG tablet Take 1 tablet (500 mg total) by mouth 2 (two) times daily. Patient not taking: Reported on 07/30/2021 01/15/19   Dahlia Byes A, NP  montelukast (SINGULAIR) 10 MG tablet Take by mouth. Patient not taking: Reported on 07/30/2021 01/24/16   [provider]  norethindrone (MICRONOR) 0.35 MG tablet Take 1 tablet by mouth daily.    [provider]  predniSONE (DELTASONE) 20 MG tablet Take 2 tablets daily with breakfast. Patient not taking: Reported on 07/30/2021 04/24/20   Wallis Bamberg, PA-C  promethazine-dextromethorphan (PROMETHAZINE-DM) 6.25-15 MG/5ML syrup Take 5 mLs by mouth at bedtime as needed for cough. Patient not taking: Reported on 07/30/2021 04/24/20   Wallis Bamberg, PA-C    Family History History reviewed. No pertinent family history.  Social History Social History   Tobacco Use   Smoking status: Never   Smokeless tobacco: Never  Substance Use Topics   Alcohol use: No     Allergies   Iodinated contrast media and Citrus   Review of Systems Review of Systems   Physical Exam Triage Vital Signs ED Triage Vitals  Enc Vitals Group     BP 07/30/21 1005 124/88     Pulse Rate 07/30/21 1005 66     Resp 07/30/21 1005 14     Temp 07/30/21 1005 99.1  F (37.3 C)     Temp Source 07/30/21 1005 Oral     SpO2 07/30/21 1005 99 %     Weight 07/30/21 1007 118 lb (53.5 kg)     Height --      Head Circumference --      Peak Flow --      Pain Score 07/30/21 1007 4     Pain Loc --      Pain Edu? --      Excl. in GC? --    No data found.  Updated Vital Signs BP 124/88 (BP Location: Left Arm)    Pulse 66    Temp 99.1 F (37.3 C) (Oral)    Resp 14    Wt 118 lb (53.5 kg)    SpO2 99%    BMI 20.90 kg/m    Physical Exam Vitals and nursing note reviewed.  Constitutional:      General: She is not in acute distress.    Appearance: Normal appearance. She  is normal weight. She is not ill-appearing.  HENT:     Head: Normocephalic and atraumatic.     Mouth/Throat:     Mouth: Mucous membranes are moist.     Pharynx: Oropharynx is clear.  Eyes:     Extraocular Movements: Extraocular movements intact.     Conjunctiva/sclera: Conjunctivae normal.     Pupils: Pupils are equal, round, and reactive to light.  Cardiovascular:     Rate and Rhythm: Normal rate and regular rhythm.     Pulses: Normal pulses.     Heart sounds: Normal heart sounds.  Pulmonary:     Effort: Pulmonary effort is normal.     Breath sounds: Normal breath sounds.  Musculoskeletal:     Cervical back: Normal range of motion and neck supple.  Skin:    General: Skin is warm and dry.     Comments: Face, bilateral inferior orbit area, right side of superior mouth: Subacute eczematous changes seen with peripheral scaling, mild erythema, fissuring and crusting over dry skin dermatitis noted  Neurological:     General: No focal deficit present.     Mental Status: She is alert and oriented to person, place, and time.     UC Treatments / Results  Labs (all labs ordered are listed, but only abnormal results are displayed) Labs Reviewed - No data to display  EKG   Radiology No results found.  Procedures Procedures (including critical care time)  Medications Ordered in UC Medications - No data to display  Initial Impression / Assessment and Plan / UC Course  I have reviewed the triage vital signs and the nursing notes.  Pertinent labs & imaging results that were available during my care of the patient were reviewed by me and considered in my medical decision making (see chart for details).     MDM: Flexural eczema-Rx'd Hydrocortisone probutate 0.1% cream. Advised patient to apply topical cream as directed.  Advised patient if eczema of face worsens and/or unresolved after 10-14 days please follow-up with dermatology for further evaluation.  Patient discharged home,  hemodynamically stable. Final Clinical Impressions(s) / UC Diagnoses   Final diagnoses:  Flexural eczema     Discharge Instructions      Advised patient to apply topical cream as directed.  Advised patient if eczema of face worsens and/or unresolved after 10-14 days please follow-up with dermatology for further evaluation.     ED Prescriptions     Medication Sig Dispense Auth. Provider  Hydrocortisone Probutate 0.1 % CREA Apply topically to affected areas of face twice daily, as needed 30 g Trevor Iha, FNP      PDMP not reviewed this encounter.   Trevor Iha, FNP 07/30/21 1107

## 2021-07-30 NOTE — Discharge Instructions (Addendum)
Advised patient to apply topical cream as directed.  Advised patient if eczema of face worsens and/or unresolved after 10-14 days please follow-up with dermatology for further evaluation.

## 2021-08-03 ENCOUNTER — Telehealth: Payer: Self-pay | Admitting: Family Medicine

## 2021-08-03 ENCOUNTER — Telehealth: Payer: Self-pay

## 2021-08-03 MED ORDER — FLUOCINOLONE ACETONIDE 0.01 % EX CREA: TOPICAL_CREAM | CUTANEOUS | 0 refills | Status: AC

## 2021-08-03 NOTE — Telephone Encounter (Signed)
Pt calls to report that insurance will not cover Rx cream ordered by M. Ragan, FNP at [facility] visit several days ago for rash. Discussed w/ M. Ave Filter, FNP. States he is ordering a different cream--submitting to pt's preferred pharmacy. Per M. Ragan, FNP, pt advised to see dermatologist if rash has not improved/subsided within 10-14 days. Pt verbalizes understanding--to call back if there are further problems w/ getting Rx.

## 2021-08-03 NOTE — Telephone Encounter (Signed)
Patient called clinic to report hydrocortisone probutate prescribed to her on Saturday, 07/29/2021 was not covered by her insurance and needs equivalent topical steroid cream sent to her pharmacy.  New medication was sent electronically per patient's request.

## 2022-01-22 ENCOUNTER — Ambulatory Visit (HOSPITAL_COMMUNITY)
Admission: EM | Admit: 2022-01-22 | Discharge: 2022-01-22 | Disposition: A | Discharge status: Home / Self Care | Payer: BC Managed Care – PPO | Attending: Emergency Medicine | Admitting: Emergency Medicine

## 2022-01-22 ENCOUNTER — Ambulatory Visit: Admission: EM | Admit: 2022-01-22 | Discharge status: Absent without leave | Payer: BC Managed Care – PPO

## 2022-01-22 DIAGNOSIS — Z202 Contact with and (suspected) exposure to infections with a predominantly sexual mode of transmission: Secondary | ICD-10-CM | POA: Diagnosis present

## 2022-01-22 DIAGNOSIS — B9689 Other specified bacterial agents as the cause of diseases classified elsewhere: Secondary | ICD-10-CM | POA: Diagnosis present

## 2022-01-22 DIAGNOSIS — N760 Acute vaginitis: Secondary | ICD-10-CM | POA: Insufficient documentation

## 2022-01-22 LAB — POCT URINALYSIS DIPSTICK, ED / UC
Bilirubin Urine: NEGATIVE
Glucose, UA: NEGATIVE mg/dL
Ketones, ur: NEGATIVE mg/dL
Leukocytes,Ua: NEGATIVE
Nitrite: NEGATIVE
Protein, ur: NEGATIVE mg/dL
Specific Gravity, Urine: <=1.005 (ref 1.005–1.030)
Urobilinogen, UA: 0.2 mg/dL (ref 0.0–1.0)
pH: 6 (ref 5.0–8.0)

## 2022-01-22 LAB — POC URINE PREG, ED: Preg Test, Ur: NEGATIVE

## 2022-01-22 MED ORDER — METRONIDAZOLE 500 MG PO TABS: 500.0000 mg | ORAL_TABLET | Freq: Two times a day (BID) | ORAL | 0 refills | Status: AC

## 2022-01-22 NOTE — ED Triage Notes (Signed)
Pt reports fishy odor for several days. She reports having un protective sex.

## 2022-01-22 NOTE — ED Provider Notes (Signed)
[facility]    CSN: [id number] Arrival date & time: 01/22/22  1609     History   Chief Complaint Chief Complaint  Patient presents with   Exposure to STD    HPI Barbara Peterson is a 26 y.o. female.  Presents with 3-day history of increased vaginal discharge with fishy odor.  Reports history of BV, last was about 3 years ago.  Similar symptoms. She did have unprotected intercourse about a week and half ago.  Unknown if any exposures to STDs. Denies any dyspareunia, abdominal pain, nausea, vomiting/diarrhea.  Denies any bleeding or spotting.  No urinary symptoms, flank pain, fever.  Past Medical History:  Diagnosis Date   Asthma    Protein S deficiency (HCC)     There are no problems to display for this patient.   Past Surgical History:  Procedure Laterality Date   WISDOM TOOTH EXTRACTION      OB History   No obstetric history on file.      Home Medications    Prior to Admission medications   Medication Sig Start Date End Date Taking? Authorizing Provider  metroNIDAZOLE (FLAGYL) 500 MG tablet Take 1 tablet (500 mg total) by mouth 2 (two) times daily for 7 days. 01/22/22 01/29/22 Yes Sarp Vernier, Lurena Joiner, PA-C  albuterol (PROVENTIL HFA;VENTOLIN HFA) 108 (90 Base) MCG/ACT inhaler Inhale 2 puffs into the lungs every 4 (four) hours as needed for wheezing or shortness of breath. 03/31/18   Mardella Layman, MD  benzonatate (TESSALON) 100 MG capsule Take 1-2 capsules (100-200 mg total) by mouth 3 (three) times daily as needed. Patient not taking: Reported on 07/30/2021 04/24/20   Wallis Bamberg, PA-C  busPIRone (BUSPAR) 10 MG tablet Take by mouth. Patient not taking: Reported on 07/30/2021    [provider]  cyclobenzaprine (FLEXERIL) 5 MG tablet Take 1 tablet (5 mg total) by mouth 2 (two) times daily as needed for muscle spasms. Patient not taking: Reported on 07/30/2021 02/26/19   Hedges, Tinnie Gens, PA-C  dicyclomine (BENTYL) 10 MG capsule Take by mouth. Patient not  taking: Reported on 07/30/2021 03/26/16   [provider]  fluocinolone (VANOS) 0.01 % cream Apply topically to affected areas of face twice daily, as needed 08/03/21   Trevor Iha, FNP  fluticasone [facility]) 50 MCG/ACT nasal spray Place into both nostrils daily.    [provider]  Fluticasone-Salmeterol (ADVAIR DISKUS) 100-50 MCG/DOSE AEPB Advair Diskus 100 mcg-50 mcg/dose powder for inhalation  INL 2 PFS PO D    [provider]  Hydrocortisone Probutate 0.1 % CREA Apply topically to affected areas of face twice daily, as needed 07/30/21   Trevor Iha, FNP  montelukast (SINGULAIR) 10 MG tablet Take by mouth. Patient not taking: Reported on 07/30/2021 01/24/16   [provider]  norethindrone (MICRONOR) 0.35 MG tablet Take 1 tablet by mouth daily.    [provider]  predniSONE (DELTASONE) 20 MG tablet Take 2 tablets daily with breakfast. Patient not taking: Reported on 07/30/2021 04/24/20   Wallis Bamberg, PA-C  promethazine-dextromethorphan (PROMETHAZINE-DM) 6.25-15 MG/5ML syrup Take 5 mLs by mouth at bedtime as needed for cough. Patient not taking: Reported on 07/30/2021 04/24/20   Wallis Bamberg, PA-C    Family History No family history on file.  Social History Social History   Tobacco Use   Smoking status: Never   Smokeless tobacco: Never  Substance Use Topics   Alcohol use: No     Allergies   Iodinated contrast media and Citrus   Review  of Systems Review of Systems Per HPI  Physical Exam Triage Vital Signs ED Triage Vitals [01/22/22 1706]  Enc Vitals Group     BP 126/86     Pulse Rate 74     Resp 18     Temp 98.3 F (36.8 C)     Temp Source Oral     SpO2 99 %     Weight      Height      Head Circumference      Peak Flow      Pain Score      Pain Loc      Pain Edu?      Excl. in GC?    No data found.  Updated Vital Signs BP 126/86 (BP Location: Left Arm)   Pulse 74   Temp 98.3 F (36.8 C) (Oral)   Resp 18   SpO2  99%     Physical Exam Vitals and nursing note reviewed.  Constitutional:      General: She is not in acute distress.    Appearance: Normal appearance.  HENT:     Mouth/Throat:     Pharynx: Oropharynx is clear.  Eyes:     Conjunctiva/sclera: Conjunctivae normal.     Pupils: Pupils are equal, round, and reactive to light.  Cardiovascular:     Rate and Rhythm: Normal rate and regular rhythm.     Pulses: Normal pulses.  Pulmonary:     Effort: Pulmonary effort is normal.  Abdominal:     Tenderness: There is no abdominal tenderness. There is no right CVA tenderness or left CVA tenderness.  Neurological:     Mental Status: She is alert and oriented to person, place, and time.     UC Treatments / Results  Labs (all labs ordered are listed, but only abnormal results are displayed) Labs Reviewed  POCT URINALYSIS DIPSTICK, ED / UC - Abnormal; Notable for the following components:      Result Value   Hgb urine dipstick TRACE (*)    All other components within normal limits  POC URINE PREG, ED  CERVICOVAGINAL ANCILLARY ONLY    EKG   Radiology No results found.  Procedures Procedures  Medications Ordered in UC Medications - No data to display  Initial Impression / Assessment and Plan / UC Course  I have reviewed the triage vital signs and the nursing notes.  Pertinent labs & imaging results that were available during my care of the patient were reviewed by me and considered in my medical decision making (see chart for details).   Urine pregnancy negative. Urinalysis with trace hemoglobin, likely from vaginal discharge.  Patient denies urinary symptoms. Cytology swab is pending at this time, however with her current symptoms will treat for BV with Flagyl. Discussed side effects. Recommend abstain from intercourse until results return.  Patient agrees to plan.  Final Clinical Impressions(s) / UC Diagnoses   Final diagnoses:  BV (bacterial vaginosis)  Possible exposure  to STD     Discharge Instructions      Please take medication as prescribed. It is important to not drink alcohol while using this medicine as it can make you very sick.  Please abstain from intercourse until the rest of your results return. We will call you if anything is positive.     ED Prescriptions     Medication Sig Dispense Auth. Provider   metroNIDAZOLE (FLAGYL) 500 MG tablet Take 1 tablet (500 mg total) by mouth 2 (two) times daily  for 7 days. 14 tablet Rehanna Oloughlin, Lurena Joiner, PA-C      PDMP not reviewed this encounter.   Velma Agnes, Lurena Joiner, [place] 01/22/22 1759

## 2022-01-22 NOTE — Discharge Instructions (Addendum)
Please take medication as prescribed. It is important to not drink alcohol while using this medicine as it can make you very sick.  Please abstain from intercourse until the rest of your results return. We will call you if anything is positive.

## 2022-01-23 LAB — CERVICOVAGINAL ANCILLARY ONLY
Bacterial Vaginitis (gardnerella): POSITIVE — AB
Candida Glabrata: NEGATIVE
Candida Vaginitis: POSITIVE — AB
Chlamydia: NEGATIVE
Comment: NEGATIVE
Comment: NEGATIVE
Comment: NEGATIVE
Comment: NEGATIVE
Comment: NEGATIVE
Comment: NORMAL
Neisseria Gonorrhea: NEGATIVE
Trichomonas: NEGATIVE

## 2022-01-24 ENCOUNTER — Telehealth (HOSPITAL_COMMUNITY): Payer: Self-pay | Admitting: Emergency Medicine

## 2022-01-24 MED ORDER — FLUCONAZOLE 150 MG PO TABS: 150.0000 mg | ORAL_TABLET | Freq: Once | ORAL | 0 refills | Status: AC

## 2022-06-29 ENCOUNTER — Other Ambulatory Visit (HOSPITAL_COMMUNITY)
Admission: RE | Admit: 2022-06-29 | Discharge: 2022-06-29 | Disposition: A | Discharge status: Home / Self Care | Payer: BC Managed Care – PPO | Source: Ambulatory Visit | Attending: Nurse Practitioner | Admitting: Nurse Practitioner

## 2022-06-29 DIAGNOSIS — R87610 Atypical squamous cells of undetermined significance on cytologic smear of cervix (ASC-US): Secondary | ICD-10-CM | POA: Insufficient documentation

## 2022-07-04 LAB — CYTOLOGY - PAP
Comment: NEGATIVE
Diagnosis: UNDETERMINED — AB
High risk HPV: NEGATIVE

## 2023-07-04 ENCOUNTER — Other Ambulatory Visit (HOSPITAL_COMMUNITY)
Admission: RE | Admit: 2023-07-04 | Discharge: 2023-07-04 | Disposition: A | Discharge status: Home / Self Care | Payer: 59 | Source: Ambulatory Visit | Attending: Nurse Practitioner | Admitting: Nurse Practitioner

## 2023-07-04 DIAGNOSIS — Z124 Encounter for screening for malignant neoplasm of cervix: Secondary | ICD-10-CM | POA: Diagnosis present

## 2023-07-08 LAB — CYTOLOGY - PAP
Comment: NEGATIVE
Diagnosis: NEGATIVE
High risk HPV: NEGATIVE

## 2024-06-17 ENCOUNTER — Encounter (INDEPENDENT_AMBULATORY_CARE_PROVIDER_SITE_OTHER): Payer: Self-pay

## 2024-06-17 ENCOUNTER — Ambulatory Visit (INDEPENDENT_AMBULATORY_CARE_PROVIDER_SITE_OTHER)

## 2024-06-17 VITALS — BP 108/73 | HR 77 | Ht 63.0 in | Wt 120.0 lb

## 2024-06-17 DIAGNOSIS — J3489 Other specified disorders of nose and nasal sinuses: Secondary | ICD-10-CM

## 2024-06-17 DIAGNOSIS — H938X3 Other specified disorders of ear, bilateral: Secondary | ICD-10-CM | POA: Diagnosis not present

## 2024-06-17 DIAGNOSIS — J328 Other chronic sinusitis: Secondary | ICD-10-CM | POA: Diagnosis not present

## 2024-06-17 DIAGNOSIS — J343 Hypertrophy of nasal turbinates: Secondary | ICD-10-CM | POA: Diagnosis not present

## 2024-06-17 NOTE — Progress Notes (Signed)
 Dear Dr. Stacia, Here is my assessment for our mutual patient, Barbara Peterson. Thank you for allowing me the opportunity to care for your patient. Please do not hesitate to contact me should you have any other questions. Sincerely, Dr. Hadassah Parody  Otolaryngology Clinic Note Referring provider: Dr. Stacia HPI:   Initial HPI (06/17/2024) 28 year old female with recurrent sinus infections and allergic rhinitis who presents for evaluation of frequent sinus infections.  Reports she experiences 3 sinus infections annually.  2 episodes occurred in close succession last month, which is a new pattern for her.  For this reason she wanted evaluation of her sinuses.  During acute episodes of sinusitis she develops headaches, persistent nasal congestion, purulent nasal drainage.  Postnasal drainage proteases well as well as sensation of ear congestion bilaterally She does not have significant nasal obstruction and sense of smell is not affected.  For recent infection, she received prednisone  for the first time and she noted marked improvement.  Complete resolution however required 2 weeks.  When she is not having a sinus infection, she does have chronic nasal congestion and drainage.  Persistent nasal congestion present at baseline despite Flonase use.  Nasal drainage more prominent in the spring and fall coincided with increased pollen and seasonal changes.  Symptoms worsened with cold air.  No prior sinus or ear surgery.  No CT scan in the past. She uses Flonase daily.  Despite this she still feels congested but if she does not use Flonase she will be significantly more congested.   Independent Review of Additional Tests or Records:  05/22/2024 referral note Barbara Stacia, PA: Patient called in stating they have a sinus infection wondering if they can get a refill on a steroid and referral to ENT   PMH/Meds/All/SocHx/FamHx/ROS:   Past Medical History:  Diagnosis Date   Asthma     Protein S deficiency      Past Surgical History:  Procedure Laterality Date   WISDOM TOOTH EXTRACTION      No family history on file.   Social Connections: Not on file     Current Outpatient Medications  Medication Instructions   albuterol  (PROVENTIL  HFA;VENTOLIN  HFA) 108 (90 Base) MCG/ACT inhaler 2 puffs, Inhalation, Every 4 hours PRN   benzonatate  (TESSALON ) 100-200 mg, Oral, 3 times daily PRN   BREO ELLIPTA 200-25 MCG/ACT AEPB 1 puff, Daily   busPIRone (BUSPAR) 10 MG tablet Take by mouth.   cyclobenzaprine  (FLEXERIL ) 5 mg, Oral, 2 times daily PRN   dicyclomine (BENTYL) 10 MG capsule Take by mouth.   EPINEPHrine (EPI-PEN) 0.3 mg, As needed   fluocinolone  (VANOS) 0.01 % cream Apply topically to affected areas of face twice daily, as needed   fluticasone (FLONASE) 50 MCG/ACT nasal spray Daily   Fluticasone-Salmeterol (ADVAIR DISKUS) 100-50 MCG/DOSE AEPB Advair Diskus 100 mcg-50 mcg/dose powder for inhalation  INL 2 PFS PO D   Hydrocortisone  Probutate 0.1 % CREA Apply topically to affected areas of face twice daily, as needed   montelukast (SINGULAIR) 10 MG tablet Take by mouth.   Multiple Vitamin (MULTI-VITAMIN) tablet 1 tablet, Daily   norethindrone (MICRONOR) 0.35 MG tablet 1 tablet, Oral, Daily   pantoprazole (PROTONIX) 20 mg, 2 times daily   predniSONE  (DELTASONE ) 20 MG tablet Take 2 tablets daily with breakfast.   promethazine -dextromethorphan (PROMETHAZINE -DM) 6.25-15 MG/5ML syrup 5 mLs, Oral, At bedtime PRN     Physical Exam:   BP 108/73 (BP Location: Left Arm, Patient Position: Sitting)   Pulse 77   Ht 5' 3 (  1.6 m)   Wt 120 lb (54.4 kg)   SpO2 97%   BMI 21.26 kg/m   Salient findings:  CN II-XII intact   Bilateral EAC clear and TM intact with well pneumatized middle ear spaces  Anterior rhinoscopy: Septum midline anteriorly; bilateral inferior turbinates with hypertrophy Nasal endoscopy was indicated to better evaluate the nose and paranasal sinuses, given  the patient's history and exam findings, and is detailed below.  No lesions of oral cavity/oropharynx  No obviously palpable neck masses/lymphadenopathy/thyromegaly  No respiratory distress or stridor  Seprately Identifiable Procedures:  Prior to initiating any procedures, risks/benefits/alternatives were explained to the patient and verbal consent obtained.  PROCEDURE (06/17/2024): Bilateral Diagnostic Rigid Nasal Endoscopy Pre-procedure diagnosis: Concern for chronic sinusitis Post-procedure diagnosis: same Indication: See pre-procedure diagnosis and physical exam above Complications: None apparent EBL: 0 mL Anesthesia: Lidocaine  4% and topical decongestant was topically sprayed in each nasal cavity  Description of Procedure:  Patient was identified. A rigid 30 degree endoscope was utilized to evaluate the sinonasal cavities, mucosa, sinus ostia and turbinates and septum.  Overall, signs of mucosal inflammation are noted.  Also noted are boggy nasal mucosa throughout, bilateral turbinate hypertrophy.  No mucopurulence, polyps, or masses noted.   Right Middle meatus: Clear Right SE Recess: Clear Left MM: Clear Left SE Recess: Clear Photodocumentation was obtained.  CPT CODE -- 68768 - Mod 25    Impression & Plans:  Barbara Peterson is a 28 y.o. female with   1. Other chronic sinusitis   2. Nasal obstruction   3. Hypertrophy of both inferior nasal turbinates   4. Sensation of fullness in both ears     Assessment and Plan Assessment & Plan Chronic sinusitis Nasal congestion Bilateral inferior turbinate hypertrophy Chronic sinusitis with 2-3 recurrent episodes per year and persistent baseline congestion and drainage. Currently asymptomatic after recent prednisone , but with ongoing baseline congestion and intermittent aural fullness. Nasal endoscopy demonstrated turbinate hypertrophy and patent drainage pathways without evidence of active infection or obstruction. No prior sinus  surgery or sinus CT. Sinus CT is indicated to evaluate for underlying chronic sinus disease or anatomical contributors to recurrent infections and persistent symptoms. - Performed nasal endoscopy after administration of topical nasal anesthetic and decongestant, revealing enlarged turbinates, no evidence of active infection from bilateral middle meatus or sphenoethmoidal recess. - Ordered non-contrast sinus CT to assess for chronic sinus disease or anatomical abnormalities. - Advised continuation of daily intranasal corticosteroid (Fluticasone) for turbinate hypertrophy and symptom control. - Discussed the option of additional allergy-directed therapy if symptoms worsen with seasonal changes; deferred at this time. - Provided instructions for scheduling the CT scan and advised to call if not contacted to arrange the study. - Scheduled follow-up in approximately one month to review CT results and determine further management.  Bilateral aural fullness No fluid on exam today.  Ears are healthy.  Continue Flonase   See below regarding exact medications prescribed this encounter including dosages and route: No orders of the defined types were placed in this encounter.   Thank you for allowing me the opportunity to care for your patient. Please do not hesitate to contact me should you have any other questions.  Sincerely, Hadassah Parody, MD Otolaryngologist (ENT), [facility] Phone: [phone] Fax: [phone]  MDM:  Level 4 Complexity/Problems addressed: 4-multiple chronic problems Data complexity: 3-  independent review of referral note, ordered CT scan - Morbidity: Low-   - Prescription Drug prescribed or managed: No

## 2024-06-17 NOTE — Patient Instructions (Signed)
 I have ordered an imaging study for you to complete prior to your next visit. Please call [facility] Scheduling at [phone] to schedule your imaging if you have not received a call within 24 hours. If you are unable to complete your imaging study prior to your next scheduled visit please call our office to let us  know.

## 2024-07-02 ENCOUNTER — Ambulatory Visit (HOSPITAL_COMMUNITY)
Admission: RE | Admit: 2024-07-02 | Discharge: 2024-07-02 | Disposition: A | Discharge status: Home / Self Care | Source: Ambulatory Visit

## 2024-07-02 DIAGNOSIS — J328 Other chronic sinusitis: Secondary | ICD-10-CM | POA: Insufficient documentation

## 2024-07-22 ENCOUNTER — Ambulatory Visit (INDEPENDENT_AMBULATORY_CARE_PROVIDER_SITE_OTHER)

## 2024-07-22 ENCOUNTER — Encounter (INDEPENDENT_AMBULATORY_CARE_PROVIDER_SITE_OTHER): Payer: Self-pay

## 2024-07-22 VITALS — BP 123/78 | HR 78 | Temp 98.9°F

## 2024-07-22 DIAGNOSIS — J3489 Other specified disorders of nose and nasal sinuses: Secondary | ICD-10-CM

## 2024-07-22 DIAGNOSIS — J343 Hypertrophy of nasal turbinates: Secondary | ICD-10-CM

## 2024-07-22 DIAGNOSIS — R0981 Nasal congestion: Secondary | ICD-10-CM

## 2024-07-22 DIAGNOSIS — J329 Chronic sinusitis, unspecified: Secondary | ICD-10-CM

## 2024-07-22 NOTE — Progress Notes (Signed)
 Dear Dr. Rexford ref. provider found, Here is my assessment for our mutual patient, Barbara Peterson. Thank you for allowing me the opportunity to care for your patient. Please do not hesitate to contact me should you have any other questions. Sincerely, Dr. Hadassah Parody  Otolaryngology Clinic Note Referring provider: Dr. Rexford ref. provider found HPI:   Initial HPI (06/17/2024) 29 year old female with recurrent sinus infections and allergic rhinitis who presents for evaluation of frequent sinus infections.  Reports she experiences 3 sinus infections annually.  2 episodes occurred in close succession last month, which is a new pattern for her.  For this reason she wanted evaluation of her sinuses.  During acute episodes of sinusitis she develops headaches, persistent nasal congestion, purulent nasal drainage.  Postnasal drainage proteases well as well as sensation of ear congestion bilaterally She does not have significant nasal obstruction and sense of smell is not affected.  For recent infection, she received prednisone  for the first time and she noted marked improvement.  Complete resolution however required 2 weeks.  When she is not having a sinus infection, she does have chronic nasal congestion and drainage.  Persistent nasal congestion present at baseline despite Flonase use.  Nasal drainage more prominent in the spring and fall coincided with increased pollen and seasonal changes.  Symptoms worsened with cold air.  No prior sinus or ear surgery.  No CT scan in the past. She uses Flonase daily.  Despite this she still feels congested but if she does not use Flonase she will be significantly more congested.  --------------------------------------------------------- 07/22/2024  Returns for follow-up with sinus imaging.  No major changes in her symptoms since she was last seen.  No new episodes of sinus infections.  She had a single episode of epistaxis.  She does have some persistent sensation  of nasal stuffiness, using Flonase, no use of nasal saline irrigations.   Independent Review of Additional Tests or Records:  05/22/2024 referral note Alexus La Bajada, PA: Patient called in stating they have a sinus infection wondering if they can get a refill on a steroid and referral to ENT  CT sinus 07/02/2024 independently reviewed showing clear sinuses throughout, bilateral inferior turbinate hypertrophy, right concha bullosa   PMH/Meds/All/SocHx/FamHx/ROS:   Past Medical History:  Diagnosis Date   Asthma    Protein S deficiency      Past Surgical History:  Procedure Laterality Date   WISDOM TOOTH EXTRACTION      No family history on file.   Social Connections: Not on file     Current Outpatient Medications  Medication Instructions   albuterol  (PROVENTIL  HFA;VENTOLIN  HFA) 108 (90 Base) MCG/ACT inhaler 2 puffs, Inhalation, Every 4 hours PRN   benzonatate  (TESSALON ) 100-200 mg, Oral, 3 times daily PRN   BREO ELLIPTA 200-25 MCG/ACT AEPB 1 puff, Daily   busPIRone (BUSPAR) 10 MG tablet Take by mouth.   cyclobenzaprine  (FLEXERIL ) 5 mg, Oral, 2 times daily PRN   dicyclomine (BENTYL) 10 MG capsule Take by mouth.   EPINEPHrine (EPI-PEN) 0.3 mg, As needed   fluocinolone  (VANOS) 0.01 % cream Apply topically to affected areas of face twice daily, as needed   fluticasone (FLONASE) 50 MCG/ACT nasal spray Daily   Fluticasone-Salmeterol (ADVAIR DISKUS) 100-50 MCG/DOSE AEPB Advair Diskus 100 mcg-50 mcg/dose powder for inhalation  INL 2 PFS PO D   Hydrocortisone  Probutate 0.1 % CREA Apply topically to affected areas of face twice daily, as needed   montelukast (SINGULAIR) 10 MG tablet Take by mouth.   Multiple  Vitamin (MULTI-VITAMIN) tablet 1 tablet, Daily   norethindrone (MICRONOR) 0.35 MG tablet 1 tablet, Daily   pantoprazole (PROTONIX) 20 mg, 2 times daily   predniSONE  (DELTASONE ) 20 MG tablet Take 2 tablets daily with breakfast.   promethazine -dextromethorphan (PROMETHAZINE -DM)  6.25-15 MG/5ML syrup 5 mLs, Oral, At bedtime PRN     Physical Exam:   BP 123/78 (BP Location: Right Arm, Patient Position: Sitting)   Pulse 78   Temp 98.9 F (37.2 C)   SpO2 99%   Salient findings:  CN II-XII intact   Bilateral EAC clear and TM intact with well pneumatized middle ear spaces  Anterior rhinoscopy: Septum midline anteriorly; bilateral inferior turbinates with hypertrophy  No lesions of oral cavity/oropharynx  No obviously palpable neck masses/lymphadenopathy/thyromegaly  No respiratory distress or stridor  Seprately Identifiable Procedures:  Prior to initiating any procedures, risks/benefits/alternatives were explained to the patient and verbal consent obtained. none    Impression & Plans:  Barbara Peterson is a 29 y.o. female with   1. Hypertrophy of both inferior nasal turbinates   2. Concha bullosa   3. Nasal congestion   4. Recurrent sinusitis     Recurrent sinus infections She experiences recurrent sinus infections but clears them without chronic symptoms. Recent sinus CT demonstrated no evidence of chronic sinusitis or obstruction of sinus drainage pathways; mild narrowing may prolong recovery from infections. No current signs of active infection. Recent epistaxis is likely secondary to nasal dryness rather than sinus disease. - Recommended nightly nasal saline gel to reduce nasal dryness and prevent epistaxis. - Advised nasal saline rinses during acute sinus infections to facilitate secretion clearance and reduce symptom duration.  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 No significant nasal obstruction at present today. Current intranasal fluticasone may reduce turbinate size and congestion. Surgical reduction may be considered if persistent, nasal obstruction develop that impacts  quality of life - Recommended continued use of intranasal fluticasone (Flonase) as needed for congestion. - Discussed that surgical reduction of turbinates may be considered in the future if persistent, severe nasal obstruction occurs, but is not indicated currently.   See below regarding exact medications prescribed this encounter including dosages and route: No orders of the defined types were placed in this encounter.   Thank you for allowing me the opportunity to care for your patient. Please do not hesitate to contact me should you have any other questions.  Sincerely, Hadassah Parody, MD Otolaryngologist (ENT), [facility] Phone: [phone] Fax: [phone]  MDM:  Level 4 Complexity/Problems addressed: 4-multiple chronic problems Data complexity: 4-independent review of CT scan - Morbidity: Low-   - Prescription Drug prescribed or managed: No

## 2024-07-22 NOTE — Patient Instructions (Signed)
Lloyd Huger Med Nasal Saline Rinse   - start nasal saline rinses with [employer]Med Bottle available over the counter    Nasal Saline Irrigation instructions: If you choose to make your own salt water solution, You will need: Salt (kosher, canning, or pickling salt) Baking soda Nasal irrigation bottle (i.e. Lloyd Huger Med Sinus Rinse) Measuring spoon ( teaspoon) Distilled / boiled water   Mix solution Mix 1 teaspoon of salt, 1/2 teaspoon of baking soda and 1 cup of water into irrigation bottle ** May use saline packet instead of homemade recipe for this step if you prefer If medicine was prescribed to be mixed with solution, place this into bottle Examples 2 inches of 2% mupirocin ointment Budesonide solution Position your head: Lean over sink (about 45 degrees) Rotate head (about 45 degrees) so that one nostril is above the other Irrigate Insert tip of irrigation bottle into upper nostril so it forms a comfortable seal Irrigate while breathing through your mouth May remove the straw from the bottle in order to irrigate the entire solution (important if medicine was added) Exhale through nose when finished and blow nose as necessary  Repeat on opposite side with other 1/2 of solution (120 mL) or remake solution if all 240 mL was used on first side Wash irrigation bottle regularly, replace every 3 months
